# Patient Record
Sex: Female | Born: 1990 | Race: Black or African American | Hispanic: No | Marital: Single | Smoking: Former smoker
Health system: Southern US, Community
[De-identification: ages and names within clinical notes are randomized; demographics above are authoritative.]

## PROBLEM LIST (undated history)

## (undated) DIAGNOSIS — B019 Varicella without complication: Secondary | ICD-10-CM

## (undated) DIAGNOSIS — G43909 Migraine, unspecified, not intractable, without status migrainosus: Secondary | ICD-10-CM

## (undated) DIAGNOSIS — R51 Headache: Secondary | ICD-10-CM

## (undated) HISTORY — DX: Migraine, unspecified, not intractable, without status migrainosus: G43.909

## (undated) HISTORY — DX: Headache: R51

## (undated) HISTORY — DX: Varicella without complication: B01.9

---
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
## 2008-07-10 ENCOUNTER — Ambulatory Visit: Payer: Self-pay | Admitting: Internal Medicine

## 2008-07-10 DIAGNOSIS — R51 Headache: Secondary | ICD-10-CM | POA: Insufficient documentation

## 2008-07-10 DIAGNOSIS — R519 Headache, unspecified: Secondary | ICD-10-CM | POA: Insufficient documentation

## 2008-11-12 ENCOUNTER — Emergency Department (HOSPITAL_COMMUNITY): Admission: EM | Admit: 2008-11-12 | Discharge: 2008-11-12 | Payer: Self-pay | Admitting: Family Medicine

## 2009-01-10 ENCOUNTER — Emergency Department (HOSPITAL_COMMUNITY): Admission: EM | Admit: 2009-01-10 | Discharge: 2009-01-10 | Payer: Self-pay | Admitting: Emergency Medicine

## 2009-06-26 ENCOUNTER — Emergency Department (HOSPITAL_COMMUNITY): Admission: EM | Admit: 2009-06-26 | Discharge: 2009-06-27 | Payer: Self-pay | Admitting: Emergency Medicine

## 2009-06-29 ENCOUNTER — Emergency Department (HOSPITAL_COMMUNITY): Admission: EM | Admit: 2009-06-29 | Discharge: 2009-06-29 | Payer: Self-pay | Admitting: Emergency Medicine

## 2009-10-04 ENCOUNTER — Emergency Department (HOSPITAL_COMMUNITY): Admission: EM | Admit: 2009-10-04 | Discharge: 2009-10-04 | Payer: Self-pay | Admitting: Emergency Medicine

## 2010-02-07 ENCOUNTER — Emergency Department (HOSPITAL_COMMUNITY): Admission: EM | Admit: 2010-02-07 | Discharge: 2010-02-07 | Payer: Self-pay | Admitting: Emergency Medicine

## 2010-04-21 ENCOUNTER — Emergency Department (HOSPITAL_COMMUNITY): Admission: EM | Admit: 2010-04-21 | Discharge: 2010-04-21 | Payer: Self-pay | Admitting: Emergency Medicine

## 2010-08-08 ENCOUNTER — Emergency Department (HOSPITAL_COMMUNITY): Admission: EM | Admit: 2010-08-08 | Discharge: 2010-08-08 | Payer: Self-pay | Admitting: Emergency Medicine

## 2011-02-13 ENCOUNTER — Telehealth: Payer: Self-pay | Admitting: Internal Medicine

## 2011-02-13 ENCOUNTER — Ambulatory Visit (INDEPENDENT_AMBULATORY_CARE_PROVIDER_SITE_OTHER): Payer: BC Managed Care – PPO | Admitting: Internal Medicine

## 2011-02-13 ENCOUNTER — Encounter: Payer: Self-pay | Admitting: Internal Medicine

## 2011-02-13 ENCOUNTER — Other Ambulatory Visit: Payer: BC Managed Care – PPO

## 2011-02-13 ENCOUNTER — Other Ambulatory Visit: Payer: Self-pay | Admitting: Internal Medicine

## 2011-02-13 DIAGNOSIS — IMO0002 Reserved for concepts with insufficient information to code with codable children: Secondary | ICD-10-CM | POA: Insufficient documentation

## 2011-02-13 DIAGNOSIS — R350 Frequency of micturition: Secondary | ICD-10-CM

## 2011-02-13 DIAGNOSIS — N946 Dysmenorrhea, unspecified: Secondary | ICD-10-CM

## 2011-02-13 DIAGNOSIS — D689 Coagulation defect, unspecified: Secondary | ICD-10-CM | POA: Insufficient documentation

## 2011-02-13 DIAGNOSIS — N939 Abnormal uterine and vaginal bleeding, unspecified: Secondary | ICD-10-CM | POA: Insufficient documentation

## 2011-02-13 DIAGNOSIS — F17200 Nicotine dependence, unspecified, uncomplicated: Secondary | ICD-10-CM

## 2011-02-13 LAB — CBC WITH DIFFERENTIAL/PLATELET
Eosinophils Absolute: 0.1 10*3/uL (ref 0.0–0.7)
Hemoglobin: 11.9 g/dL — ABNORMAL LOW (ref 12.0–15.0)
Lymphocytes Relative: 26.8 % (ref 12.0–46.0)
Lymphs Abs: 2 10*3/uL (ref 0.7–4.0)
MCV: 89.8 fl (ref 78.0–100.0)
Monocytes Absolute: 0.5 10*3/uL (ref 0.1–1.0)
Neutrophils Relative %: 65 % (ref 43.0–77.0)
WBC: 7.4 10*3/uL (ref 4.5–10.5)

## 2011-02-13 LAB — URINALYSIS, ROUTINE W REFLEX MICROSCOPIC
Bilirubin Urine: NEGATIVE
Ketones, ur: NEGATIVE
Leukocytes, UA: NEGATIVE
Nitrite: NEGATIVE
Urobilinogen, UA: 1 (ref 0.0–1.0)
pH: 6 (ref 5.0–8.0)

## 2011-02-13 LAB — PROTIME-INR
INR: 1 ratio (ref 0.8–1.0)
Prothrombin Time: 11.3 s (ref 10.2–12.4)

## 2011-02-13 LAB — BASIC METABOLIC PANEL
CO2: 29 mEq/L (ref 19–32)
Creatinine, Ser: 0.8 mg/dL (ref 0.4–1.2)
Glucose, Bld: 80 mg/dL (ref 70–99)

## 2011-02-13 LAB — HEPATIC FUNCTION PANEL
ALT: 11 U/L (ref 0–35)
Alkaline Phosphatase: 51 U/L (ref 39–117)
Bilirubin, Direct: 0 mg/dL (ref 0.0–0.3)
Total Bilirubin: 0.2 mg/dL — ABNORMAL LOW (ref 0.3–1.2)

## 2011-02-13 LAB — APTT: aPTT: 30.6 s — ABNORMAL HIGH (ref 21.7–28.8)

## 2011-02-14 ENCOUNTER — Encounter: Payer: Self-pay | Admitting: Internal Medicine

## 2011-02-18 NOTE — Assessment & Plan Note (Signed)
Summary: new / bcbs / # cd   Vital Signs:  Patient profile:   20 year old female Menstrual status:  regular LMP:     02/10/2011 Height:      64 inches Weight:      166.50 pounds BMI:     28.68 O2 Sat:      98 % on Room air Temp:     98.5 degrees F oral Pulse rate:   86 / minute Pulse rhythm:   regular Resp:     16 per minute BP sitting:   108 / 70  (left arm) Cuff size:   large  Vitals Entered By: Rock Nephew CMA (February 13, 2011 10:37 AM)  Nutrition Counseling: Patient's BMI is greater than 25 and therefore counseled on weight management options.  O2 Flow:  Room air CC: New to establish, Preventive Care Is Patient Diabetic? No Pain Assessment Patient in pain? no       Does patient need assistance? Functional Status Self care Ambulation Normal LMP (date): 02/10/2011     Menstrual Status regular Enter LMP: 02/10/2011   Primary Care Provider:  Etta Grandchild MD  CC:  New to establish and Preventive Care.  History of Present Illness: New to me she was sent here by ? oral surgeon to be evaluated for excessive bleeding. She tells me that one week ago she had a tooth extracted and that she had persistent bleeding for 4 days and that the OS had to cauterize the cavity to stop the bleeding. She reveals to me that she takes advil regularly for menstrual cramps. She has an occasional nosebleed and her periods are heavy but she has no other  bleeding or bruising symptoms. Her father has a history of an occasional nose bleed.  Preventive Screening-Counseling & Management  Alcohol-Tobacco     Alcohol drinks/day: <1     Alcohol type: all     >5/day in last 3 mos: no     Alcohol Counseling: not indicated; use of alcohol is not excessive or problematic     Feels need to cut down: no     Feels annoyed by complaints: no     Feels guilty re: drinking: no     Needs 'eye opener' in am: no     Smoking Status: current     Smoking Cessation Counseling: yes     Smoke Cessation  Stage: contemplative     Packs/Day: 0.5     Year Started: 2011     Pack years: 0.5     Tobacco Counseling: to quit use of tobacco products  Caffeine-Diet-Exercise     Does Patient Exercise: no  Hep-HIV-STD-Contraception     Hepatitis Risk: no risk noted     HIV Risk: no risk noted     STD Risk: no risk noted     Dental Visit-last 6 months yes     Dental Care Counseling: to seek dental care; no dental care within six months  Safety-Violence-Falls     Seat Belt Use: yes     Helmet Use: yes     Firearms in the Home: no firearms in the home     Smoke Detectors: yes     Violence in the Home: no risk noted     Sexual Abuse: no     Fall Risk: no risk noted      Sexual History:  currently monogamous.        Drug Use:  never.  Blood Transfusions:  no.    Current Medications (verified): 1)  Toviaz 4 Mg Xr24h-Tab (Fesoterodine Fumarate) .... One By Mouth Once Daily For Urinary Incontinence  Allergies (verified): No Known Drug Allergies  Past History:  Past Medical History: Last updated: 07/10/2008 Headaches  Family History: Last updated: 02/13/2011 CAD - no Stoke - no DM - M Htn - M Hyperlipidemia - M Breast Ca - no Colon Ca - no Prostate Ca - no Asthma - brother Family History of Thromboembolism clotting disorder Family History of Anemia/FE deficiency  Social History: Last updated: 02/13/2011 Occupation:  Full time at [employer] Single Not sexually active. Drug use-no Current Smoker Alcohol use-yes  Risk Factors: Alcohol Use: <1 (02/13/2011) >5 drinks/d w/in last 3 months: no (02/13/2011) Exercise: no (02/13/2011)  Risk Factors: Smoking Status: current (02/13/2011) Packs/Day: 0.5 (02/13/2011)  Past Surgical History: Denies surgical history  Family History: Reviewed history from 07/10/2008 and no changes required. CAD - no Stoke - no DM - M Htn - M Hyperlipidemia - M Breast Ca - no Colon Ca - no Prostate Ca - no Asthma -  brother Family History of Thromboembolism clotting disorder Family History of Anemia/FE deficiency  Social History: Reviewed history from 07/10/2008 and no changes required. Occupation:  Full time at [employer] Single Not sexually active. Drug use-no Current Smoker Alcohol use-yes Seat Belt Use:  yes Alcohol:  None Smoking Status:  current Does Patient Exercise:  no Fall Risk:  no risk noted Packs/Day:  0.5 Hepatitis Risk:  no risk noted HIV Risk:  no risk noted STD Risk:  no risk noted Dental Care w/in 6 mos.:  yes Sexual History:  currently monogamous Drug Use:  never Blood Transfusions:  no  Review of Systems       The patient complains of weight gain and incontinence.  The patient denies anorexia, fever, weight loss, chest pain, syncope, dyspnea on exertion, peripheral edema, prolonged cough, headaches, hemoptysis, abdominal pain, melena, hematochezia, severe indigestion/heartburn, hematuria, muscle weakness, suspicious skin lesions, transient blindness, difficulty walking, depression, unusual weight change, abnormal bleeding, enlarged lymph nodes, angioedema, and breast masses.   GU:  Complains of abnormal vaginal bleeding, incontinence, and urinary frequency; denies decreased libido, discharge, dysuria, genital sores, hematuria, nocturia, and urinary hesitancy. Heme:  Denies abnormal bruising, bleeding, enlarge lymph nodes, fevers, pallor, and skin discoloration.  Physical Exam  General:  alert, well-developed, well-nourished, well-hydrated, appropriate dress, normal appearance, healthy-appearing, cooperative to examination, good hygiene, and overweight-appearing.   Head:  normocephalic, atraumatic, no abnormalities observed, and no abnormalities palpated.   Eyes:  No corneal or conjunctival inflammation noted. EOMI. Perrla. Funduscopic exam benign, without hemorrhages, exudates or papilledema. Vision grossly normal. Ears:  R ear normal and L ear normal.   Mouth:   good dentition, pharynx pink and moist, no erythema, no exudates, no posterior lymphoid hypertrophy, no postnasal drip, no pharyngeal crowing, no lesions, no aphthous ulcers, no erosions, no tongue abnormalities, no leukoplakia, and no petechiae.   Neck:  supple, full ROM, no masses, no thyromegaly, no thyroid nodules or tenderness, no JVD, normal carotid upstroke, no carotid bruits, no cervical lymphadenopathy, and no neck tenderness.   Lungs:  normal respiratory effort, no intercostal retractions, no accessory muscle use, normal breath sounds, no dullness, no fremitus, no crackles, and no wheezes.   Heart:  normal rate, regular rhythm, no murmur, no gallop, no rub, and no JVD.   Abdomen:  soft, non-tender, normal bowel sounds, no distention, no masses, no guarding, no rigidity, no  rebound tenderness, no abdominal hernia, no inguinal hernia, no hepatomegaly, and no splenomegaly.   Msk:  No deformity or scoliosis noted of thoracic or lumbar spine.   Pulses:  R and L carotid,radial,femoral,dorsalis pedis and posterior tibial pulses are full and equal bilaterally Extremities:  No clubbing, cyanosis, edema, or deformity noted with normal full range of motion of all joints.   Neurologic:  No cranial nerve deficits noted. Station and gait are normal. Plantar reflexes are down-going bilaterally. DTRs are symmetrical throughout. Sensory, motor and coordinative functions appear intact. Skin:  turgor normal, color normal, no rashes, no suspicious lesions, no ecchymoses, no petechiae, no purpura, no ulcerations, no edema, and tattoo(s).   Cervical Nodes:  no anterior cervical adenopathy and no posterior cervical adenopathy.   Axillary Nodes:  no R axillary adenopathy and no L axillary adenopathy.   Inguinal Nodes:  no R inguinal adenopathy and no L inguinal adenopathy.   Psych:  Cognition and judgment appear intact. Alert and cooperative with normal attention span and concentration. No apparent delusions,  illusions, hallucinations   Impression & Recommendations:  Problem # 1:  HEMORRHAGE COMPLICATING A PROCEDURE NEC (ICD-998.11) Assessment New I think this was caused by her use of advil and its effects on the platelets, will check labs today to look for a coagulopathy as well Orders: TLB-BMP (Basic Metabolic Panel-BMET) (80048-METABOL) TLB-CBC Platelet - w/Differential (85025-CBCD) TLB-Hepatic/Liver Function Pnl (80076-HEPATIC) TLB-TSH (Thyroid Stimulating Hormone) (84443-TSH) TLB-PTT (85730-PTTL) TLB-PT (Protime) (85610-PTP)  Problem # 2:  FREQUENCY, URINARY (ICD-788.41) Assessment: New this sounds like OAB with urge incontinence, will start Toviaz Her updated medication list for this problem includes:    Toviaz 4 Mg Xr24h-tab (Fesoterodine fumarate) ..... One by mouth once daily for urinary incontinence  Orders: TLB-Udip w/ Micro (81001-URINE)  Problem # 3:  TOBACCO USE (ICD-305.1) Assessment: New  Encouraged smoking cessation and discussed different methods for smoking cessation.   Complete Medication List: 1)  Toviaz 4 Mg Xr24h-tab (Fesoterodine fumarate) .... One by mouth once daily for urinary incontinence  Other Orders: Gynecologic Referral (Gyn)  PAP Screening:    Reviewed PAP smear recommendations:  patient defers to GYN provider  Osteoporosis Risk Assessment:  Risk Factors for Fracture or Low Bone Density:   Smoking status:       current  Immunization & Chemoprophylaxis:    Tetanus vaccine: given  (06/12/2008)  Patient Instructions: 1)  Please schedule a follow-up appointment in 1 month. 2)  Tobacco is very bad for your health and your loved ones! You Should stop smoking!. 3)  Stop Smoking Tips: Choose a Quit date. Cut down before the Quit date. decide what you will do as a substitute when you feel the urge to smoke(gum,toothpick,exercise). 4)  It is important that you exercise regularly at least 20 minutes 5 times a week. If you develop chest pain, have  severe difficulty breathing, or feel very tired , stop exercising immediately and seek medical attention. 5)  You need to lose weight. Consider a lower calorie diet and regular exercise.  6)  You need to have a Pap Smear to prevent cervical cancer. 7)  If you could be exposed to sexually transmitted diseases, you should use a condom. 8)  If you are having sex and you or your partner don't want a child, use contraception. 9)  Take 650-1000mg  of Tylenol every 4-6 hours as needed for relief of pain or comfort of fever AVOID taking more than 4000mg   in a 24 hour period (can cause liver  damage in higher doses). Prescriptions: TOVIAZ 4 MG XR24H-TAB (FESOTERODINE FUMARATE) One by mouth once daily for urinary incontinence  #68 x 0   Entered and Authorized by:   Etta Grandchild MD   Signed by:   Etta Grandchild MD on 02/13/2011   Method used:   Samples Given   RxID:   [id number]    Orders Added: 1)  Gynecologic Referral [Gyn] 2)  TLB-BMP (Basic Metabolic Panel-BMET) [80048-METABOL] 3)  TLB-CBC Platelet - w/Differential [85025-CBCD] 4)  TLB-Hepatic/Liver Function Pnl [80076-HEPATIC] 5)  TLB-TSH (Thyroid Stimulating Hormone) [84443-TSH] 6)  TLB-PTT [85730-PTTL] 7)  TLB-PT (Protime) [85610-PTP] 8)  TLB-Udip w/ Micro [81001-URINE] 9)  New Patient Level IV [14782]

## 2011-02-18 NOTE — Letter (Signed)
Summary: Generic Letter  [facility]  [address] [place], [place] [address]   Phone: [phone]  Fax: [phone]    02/14/2011  MARCELE KOSTA [address] [facility] [place], [place]  [address]  [place]  Dear Ms. Buer,   This letter is regarding some recent lab results the we have on file for   you. Please return call to our office to discuss results and referral   appointment.      Sincerely,   Alvy Beal A CMA for Dr. Sanda Linger

## 2011-02-18 NOTE — Letter (Signed)
Summary: Results Follow-up Letter  [facility]  [address] [place], [place] [address]   Phone: [phone]  Fax: [phone]    02/13/2011  [address] [facility] [place], [place]  [address]  [place]  Dear Ms. Weldon,   The following are the results of your recent test(s):  Test     Result     CBC       mild anemia Bleeding tests   one was abnormal Liver/kidney   normal   _________________________________________________________  Please call for an appointment soon _________________________________________________________ _________________________________________________________ _________________________________________________________  Sincerely,  Sanda Linger MD [facility]

## 2011-02-18 NOTE — Progress Notes (Signed)
  Phone Note Outgoing Call   Summary of Call: LA - please tell her that one of her bleeding tests was abnormal so I have referred her to a blood specialist Initial call taken by: Etta Grandchild MD,  February 13, 2011 2:39 PM  Follow-up for Phone Call        Called pt, no personal VM and alternative # busy x 3. Will try again later.Marland KitchenMarland KitchenAlvy Beal Archie CMA  February 13, 2011 2:51 PM  Follow-up by: Etta Grandchild MD,  February 13, 2011 2:52 PM  Additional Follow-up for Phone Call Additional follow up Details #1::        Called home number on file and told this is the wrong number. Alternative number busy. will mail out letter.Marland KitchenMarland KitchenAlvy Beal Archie CMA  February 14, 2011 9:17 AM      Appended Document:  Pt informed of results and referral sent. Have correct phone#. Will update in chart.

## 2011-02-18 NOTE — Progress Notes (Signed)
     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

## 2011-02-20 ENCOUNTER — Encounter (HOSPITAL_BASED_OUTPATIENT_CLINIC_OR_DEPARTMENT_OTHER): Payer: BC Managed Care – PPO | Admitting: Hematology and Oncology

## 2011-02-20 DIAGNOSIS — D689 Coagulation defect, unspecified: Secondary | ICD-10-CM

## 2011-02-27 ENCOUNTER — Other Ambulatory Visit: Payer: Self-pay | Admitting: Hematology and Oncology

## 2011-02-27 ENCOUNTER — Encounter (HOSPITAL_BASED_OUTPATIENT_CLINIC_OR_DEPARTMENT_OTHER): Payer: BC Managed Care – PPO | Admitting: Hematology and Oncology

## 2011-02-27 DIAGNOSIS — D689 Coagulation defect, unspecified: Secondary | ICD-10-CM

## 2011-02-27 LAB — CBC WITH DIFFERENTIAL/PLATELET
BASO%: 0.9 % (ref 0.0–2.0)
Eosinophils Absolute: 0.1 10*3/uL (ref 0.0–0.5)
HCT: 37 % (ref 34.8–46.6)
MONO#: 0.3 10*3/uL (ref 0.1–0.9)
MONO%: 4.4 % (ref 0.0–14.0)
NEUT%: 65.9 % (ref 38.4–76.8)
RBC: 4.15 10*6/uL (ref 3.70–5.45)
RDW: 14 % (ref 11.2–14.5)
lymph#: 2.2 10*3/uL (ref 0.9–3.3)

## 2011-02-27 LAB — PROTIME-INR: INR: 0.9 — ABNORMAL LOW (ref 2.00–3.50)

## 2011-02-27 LAB — IVY BLEEDING TIME: Bleeding Time: 6 Minutes (ref 2.0–8.0)

## 2011-03-05 LAB — COMPREHENSIVE METABOLIC PANEL
ALT: <8 U/L (ref 0–35)
AST: 11 U/L (ref 0–37)
Albumin: 4.7 g/dL (ref 3.5–5.2)
Alkaline Phosphatase: 56 U/L (ref 39–117)
CO2: 26 mEq/L (ref 19–32)
Calcium: 9.7 mg/dL (ref 8.4–10.5)
Creatinine, Ser: 0.75 mg/dL (ref 0.40–1.20)
Potassium: 4.1 mEq/L (ref 3.5–5.3)
Total Bilirubin: 0.3 mg/dL (ref 0.3–1.2)

## 2011-03-05 LAB — POCT PREGNANCY, URINE: Preg Test, Ur: NEGATIVE

## 2011-03-05 LAB — VON WILLEBRAND FACTOR MULTIMER: Factor-VIII Activity: 58 % (ref 50–180)

## 2011-03-07 ENCOUNTER — Encounter (HOSPITAL_BASED_OUTPATIENT_CLINIC_OR_DEPARTMENT_OTHER): Payer: BC Managed Care – PPO | Admitting: Hematology and Oncology

## 2011-03-07 DIAGNOSIS — D689 Coagulation defect, unspecified: Secondary | ICD-10-CM

## 2011-03-12 ENCOUNTER — Other Ambulatory Visit: Payer: Self-pay | Admitting: Hematology and Oncology

## 2011-03-12 ENCOUNTER — Encounter (HOSPITAL_BASED_OUTPATIENT_CLINIC_OR_DEPARTMENT_OTHER): Payer: BC Managed Care – PPO | Admitting: Hematology and Oncology

## 2011-03-12 DIAGNOSIS — D689 Coagulation defect, unspecified: Secondary | ICD-10-CM

## 2011-03-14 LAB — PLATELET AGGREGATION STUDY, BLOOD
ADP5: NORMAL
Thrombin Receptor: NORMAL

## 2011-03-18 ENCOUNTER — Encounter: Payer: BC Managed Care – PPO | Admitting: Hematology and Oncology

## 2011-03-19 ENCOUNTER — Ambulatory Visit: Payer: BC Managed Care – PPO | Admitting: Internal Medicine

## 2011-03-19 DIAGNOSIS — Z0289 Encounter for other administrative examinations: Secondary | ICD-10-CM

## 2011-03-21 LAB — FACTOR 11 ASSAY: Factor XI Activity: 123 % (ref 65–150)

## 2011-03-21 LAB — ANA: Anti Nuclear Antibody(ANA): NEGATIVE

## 2011-03-21 LAB — FACTOR 8 ASSAY: Coagulation Factor VIII: 55 % — ABNORMAL LOW (ref 73–140)

## 2011-03-21 LAB — FACTOR 13 ACTIVITY: Factor XIII, Qualitative: NEGATIVE

## 2011-03-26 ENCOUNTER — Other Ambulatory Visit: Payer: Self-pay | Admitting: Hematology and Oncology

## 2011-03-26 ENCOUNTER — Encounter (HOSPITAL_BASED_OUTPATIENT_CLINIC_OR_DEPARTMENT_OTHER): Payer: BC Managed Care – PPO | Admitting: Hematology and Oncology

## 2011-03-26 DIAGNOSIS — D689 Coagulation defect, unspecified: Secondary | ICD-10-CM

## 2011-03-26 DIAGNOSIS — D699 Hemorrhagic condition, unspecified: Secondary | ICD-10-CM

## 2011-03-27 LAB — FACTOR 8 ASSAY: Coagulation Factor VIII: 49 % — ABNORMAL LOW (ref 73–140)

## 2011-04-18 NOTE — H&P (Signed)
NAMEJENDAYI, Danielle Gross               ACCOUNT NO.:  [id number]   MEDICAL RECORD NO.:  [id number]          PATIENT TYPE:  AMB   LOCATION:  SDC                           [facility]ACILITY:  [facility]   PHYSICIAN:  Charles A. Delcambre, MDDATE OF BIRTH:  1991/04/02   DATE OF ADMISSION:  06/16/2005  DATE OF DISCHARGE:                                HISTORY & PHYSICAL   This patient will be admitted through same-day surgery at [facility] on June 16, 2005, for marsupialization of Bartholin's cyst.  A 20 year old para 0-0-  0-0, from Dr. Cindi Carbon at [facility], with recurrent Bartholin abscesses  and cysts, symptomatic, now with no symptoms but a tense Bartholin cyst on  the right.  She is to be admitted for a marsupialization at this time having  completed a course of Keflex recently.   PAST MEDICAL HISTORY:  None.   PAST SURGICAL HISTORY:  None.   MEDICATIONS:  None.   ALLERGIES:  No known drug allergies.   SOCIAL HISTORY:  Negative tobacco, ethanol or drug use.  Not sexually  active.  No STDs in the past.   FAMILY HISTORY:  No major illnesses other than diabetes in her father,  hypertension in her mother.   REVIEW OF SYSTEMS:  No chest pain, shortness of breath, wheezing, diarrhea  or constipation.  Otherwise as noted above.   PHYSICAL EXAMINATION:  GENERAL:  Well-developed, well-nourished, in no  distress.  VITAL SIGNS:  Blood pressure 100/78, respirations 16, heart rate 80, weight  148 pounds.  HEENT:  Grossly within normal limits.  NECK:  Supple without thyromegaly or adenopathy.  CHEST:  Lungs clear bilaterally.  BACK:  No CVAT.  CARDIAC:  Regular rate and rhythm without murmur, rub, or gallop.  BREASTS:  Symmetrical, deferred otherwise.  ABDOMEN:  Soft, flat and nontender.  No hepatosplenomegaly or other masses  noted.  PELVIC:  Normal external female genitalia.  Bartholin's, urethra and Skene's  glands within normal limits.  Vault without discharge or lesions.  Right  Bartholin's  gland, a 3 cm tense cyst is noted, nontender.  Anus and perineal  body appear normal.  Otherwise, rectal exam deferred.   ASSESSMENT:  Right Bartholin cyst, recurrent, with recurrent abscesses, now  for marsupialization.   PLAN:  Admit for same-day surgery to undergo marsupialization.  Plan 1 g  Cefoxitin preop, preop CBC and serum pregnancy test.  All questions were  answered.  She accepts the risks of infection, bleeding, surrounding tissue  damage, blood product risk and hepatitis/HIV exposure.  All questions were  answered.  Recurrent cyst or other abscess risk is noted as well.  Complete  resolution or guarantee of resolution cannot be guaranteed.  We will proceed  as outlined.  She will remain N.P.O. past midnight.       CAD/MEDQ  D:  06/10/2005  T:  06/10/2005  Job:  [id number]

## 2011-04-18 NOTE — Op Note (Signed)
NAMETEREASA, Danielle Gross               ACCOUNT NO.:  [id number]   MEDICAL RECORD NO.:  [id number]          PATIENT TYPE:  AMB   LOCATION:  SDC                           [facility]ILITY:  [facility]   PHYSICIAN:  Charles A. Delcambre, MDDATE OF BIRTH:  [birth year]   DATE OF PROCEDURE:  06/16/2005  DATE OF DISCHARGE:                                 OPERATIVE REPORT   PREOPERATIVE DIAGNOSIS:  Bartholin cyst.   POSTOPERATIVE DIAGNOSIS:  Bartholin cyst.   OPERATION/PROCEDURE:  Marsupialization of same Bartholin cyst.   SURGEON:  Charles A. Sydnee Cabal, M.D.   ASSISTANT:  None.   COMPLICATIONS:  None.   ESTIMATED BLOOD LOSS:  Less than 10 mL.   ANESTHESIA:  General via endotracheal route.   SPECIMENS:  None except cultures, aerobic and anaerobic, of cyst to the lab.   COUNTS:  Sponge and needle counts correct x2.   DESCRIPTION OF PROCEDURE:  The patient was taken to the operating room and  placed in the supine position.  Anesthesia was induced without difficulty.  She was then placed in the dorsal lithotomy position and sterile prep and  drape was undertaken.  The Bartholin cyst was isolated, approximately 3 cm,  and 1 cm area of vaginal mucosa on the lateral sidewall was excised just  inside the introitus, but external to the hymnal ring and area of the cyst  and was entered.  Cultures were taken.  The cyst was opened and irrigated  copiously with normal saline and cyst wall was marsupialized to the vaginal  mucosa circumferentially with 2-0 Vicryl.  Hemostasis was excellent.  Word  catheter was placed to further maintain open drainage. This was tucked into  the vagina.  The procedure was then terminated and the patient was taken to  the recovery with physician in attendance.       CAD/MEDQ  D:  06/16/2005  T:  06/17/2005  Job:  [id number]

## 2011-06-10 ENCOUNTER — Ambulatory Visit (HOSPITAL_BASED_OUTPATIENT_CLINIC_OR_DEPARTMENT_OTHER): Payer: BC Managed Care – PPO | Admitting: Psychology

## 2011-06-10 DIAGNOSIS — F4323 Adjustment disorder with mixed anxiety and depressed mood: Secondary | ICD-10-CM

## 2011-06-17 ENCOUNTER — Encounter (HOSPITAL_COMMUNITY): Payer: BC Managed Care – PPO | Admitting: Psychology

## 2011-10-17 ENCOUNTER — Emergency Department (INDEPENDENT_AMBULATORY_CARE_PROVIDER_SITE_OTHER)
Admission: EM | Admit: 2011-10-17 | Discharge: 2011-10-17 | Disposition: A | Discharge status: Home / Self Care | Payer: BC Managed Care – PPO | Source: Home / Self Care | Attending: Emergency Medicine | Admitting: Emergency Medicine

## 2011-10-17 ENCOUNTER — Encounter (HOSPITAL_COMMUNITY): Payer: Self-pay | Admitting: *Deleted

## 2011-10-17 DIAGNOSIS — J40 Bronchitis, not specified as acute or chronic: Secondary | ICD-10-CM

## 2011-10-17 MED ORDER — HYDROCODONE-ACETAMINOPHEN 7.5-500 MG/15ML PO SOLN: 5.0000 mL | Freq: Four times a day (QID) | ORAL | Status: AC | PRN

## 2011-10-17 MED ORDER — IBUPROFEN 600 MG PO TABS: 600.0000 mg | ORAL_TABLET | Freq: Four times a day (QID) | ORAL | Status: AC | PRN

## 2011-10-17 MED ORDER — ALBUTEROL SULFATE HFA 108 (90 BASE) MCG/ACT IN AERS: 1.0000 | INHALATION_SPRAY | Freq: Four times a day (QID) | RESPIRATORY_TRACT | Status: DC | PRN

## 2011-10-17 NOTE — ED Notes (Signed)
C/O productive cough x 1 wk w/ greenish yellow sputum; c/o pain in right low back and hip when she coughs over past 3 days.  Also c/o "cyst" to lower abd x 1 month that is getting larger and more painful.  Has been taking Tyl Cough & Cold and IBU.  Denies fevers.

## 2011-10-17 NOTE — ED Provider Notes (Signed)
History     CSN: [id number] Arrival date & time: 10/17/2011  6:06 PM   First MD Initiated Contact with Patient 10/17/11 1743      Chief Complaint  Patient presents with  . Cough  . Cyst    (Consider location/radiation/quality/duration/timing/severity/associated sxs/prior treatment) HPI Comments: Initially with bodyaches, low grade temp. Rhinorrhea, postnasal drip, irritated throat.   Patient is a 20 y.o. female presenting with cough. The history is provided by the patient.  Cough The current episode started more than 2 days ago. The problem has not changed since onset.The cough is non-productive. The maximum temperature recorded prior to her arrival was 100 to 100.9 F. The fever has been present for 1 to 2 days (at beginning of illness. now resolved). Associated symptoms include rhinorrhea, sore throat, myalgias and wheezing. Pertinent negatives include no chest pain, no chills, no sweats, no ear congestion, no ear pain, no headaches, no shortness of breath and no eye redness. Associated symptoms comments: No posttussive emesis. The treatment provided no relief. She is a smoker. Her past medical history does not include bronchitis, pneumonia, emphysema or asthma.    Past Medical History  Diagnosis Date  . Headache     History reviewed. No pertinent past surgical history.  Family History  Problem Relation Age of Onset  . Diabetes Mother   . Hypertension Mother   . Hyperlipidemia Mother   . Asthma Brother   . Stroke Neg Hx   . Cancer Neg Hx   . Anemia Other   . Clotting disorder Other     History  Substance Use Topics  . Smoking status: Current Some Day Smoker  . Smokeless tobacco: Not on file  . Alcohol Use: Yes     occasional use    OB History    Grav Para Term Preterm Abortions TAB SAB Ect Mult Living                  Review of Systems  Constitutional: Positive for fever. Negative for chills.  HENT: Positive for congestion, sore throat, rhinorrhea and  postnasal drip. Negative for ear pain, trouble swallowing, neck pain and sinus pressure.   Eyes: Negative for redness.  Respiratory: Positive for cough and wheezing. Negative for shortness of breath.   Cardiovascular: Negative for chest pain.  Gastrointestinal: Negative for vomiting and abdominal pain.  Musculoskeletal: Positive for myalgias.  Skin: Negative for rash.  Neurological: Negative for weakness and headaches.    Allergies  Review of patient's allergies indicates no known allergies.  Home Medications   Current Outpatient Rx  Name Route Sig Dispense Refill  . ALBUTEROL SULFATE HFA 108 (90 BASE) MCG/ACT IN AERS Inhalation Inhale 1-2 puffs into the lungs every 6 (six) hours as needed for wheezing. 1 Inhaler 0  . HYDROCODONE-ACETAMINOPHEN 7.5-500 MG/15ML PO SOLN Oral Take 5 mLs by mouth every 6 (six) hours as needed for pain. 120 mL 0  . IBUPROFEN 600 MG PO TABS Oral Take 1 tablet (600 mg total) by mouth every 6 (six) hours as needed for pain. 30 tablet 0    BP 120/89  Pulse 66  Temp(Src) 98.6 F (37 C) (Oral)  Resp 14  SpO2 100%  LMP 10/09/2011  Physical Exam  Nursing note and vitals reviewed. Constitutional: She is oriented to person, place, and time. She appears well-developed and well-nourished. No distress.  HENT:  Head: Normocephalic and atraumatic.  Right Ear: Tympanic membrane normal.  Left Ear: Tympanic membrane normal.  Nose: Mucosal edema and  rhinorrhea present.  Mouth/Throat: Uvula is midline. Posterior oropharyngeal erythema present. No oropharyngeal exudate.  Eyes: EOM are normal. Pupils are equal, round, and reactive to light.  Neck: Normal range of motion.  Cardiovascular: Normal rate, regular rhythm and normal heart sounds.   Pulmonary/Chest: Effort normal and breath sounds normal. No respiratory distress. She has no wheezes. She has no rales. She exhibits tenderness.  Abdominal: Soft. Bowel sounds are normal. She exhibits no distension. There is no  tenderness.  Musculoskeletal: Normal range of motion.  Lymphadenopathy:    She has no cervical adenopathy.  Neurological: She is alert and oriented to person, place, and time.  Skin: Skin is warm and dry.  Psychiatric: She has a normal mood and affect. Her behavior is normal. Judgment and thought content normal.    ED Course  Procedures (including critical care time)  Labs Reviewed - No data to display No results found.   1. Bronchitis       MDM      Luiz Blare, MD 10/17/11 2021

## 2013-02-23 ENCOUNTER — Encounter (HOSPITAL_COMMUNITY): Payer: Self-pay | Admitting: Emergency Medicine

## 2013-02-23 ENCOUNTER — Telehealth: Payer: Self-pay | Admitting: Internal Medicine

## 2013-02-23 ENCOUNTER — Emergency Department (INDEPENDENT_AMBULATORY_CARE_PROVIDER_SITE_OTHER)
Admission: EM | Admit: 2013-02-23 | Discharge: 2013-02-23 | Disposition: A | Discharge status: Home / Self Care | Payer: BC Managed Care – PPO | Source: Home / Self Care

## 2013-02-23 DIAGNOSIS — J029 Acute pharyngitis, unspecified: Secondary | ICD-10-CM

## 2013-02-23 DIAGNOSIS — I889 Nonspecific lymphadenitis, unspecified: Secondary | ICD-10-CM

## 2013-02-23 NOTE — Telephone Encounter (Signed)
yes

## 2013-02-23 NOTE — ED Provider Notes (Signed)
History     CSN: [id number]  Arrival date & time 02/23/13  1121   First MD Initiated Contact with Patient 02/23/13 1252      Chief Complaint  Patient presents with  . Sore Throat    (Consider location/radiation/quality/duration/timing/severity/associated sxs/prior treatment) HPI Comments: 22 year old female presents with sore throat for one week. She states in the past couple days has been getting worse with  swelling in the throat, throat pain and wuite patches on her tonsils. She states that about 3 days ago she had a temperature of 99.1. Is also complaining of discomfort in her ears. Denies shortness of breath, chest pain, GI or GU symptoms.   Past Medical History  Diagnosis Date  . Headache     History reviewed. No pertinent past surgical history.  Family History  Problem Relation Age of Onset  . Diabetes Mother   . Hypertension Mother   . Hyperlipidemia Mother   . Asthma Brother   . Stroke Neg Hx   . Cancer Neg Hx   . Anemia Other   . Clotting disorder Other     History  Substance Use Topics  . Smoking status: Current Some Day Smoker  . Smokeless tobacco: Not on file  . Alcohol Use: Yes     Comment: occasional use    OB History   Grav Para Term Preterm Abortions TAB SAB Ect Mult Living                  Review of Systems  Constitutional: Positive for fever, activity change and fatigue. Negative for chills.  HENT: Positive for ear pain and sore throat. Negative for rhinorrhea, neck pain, neck stiffness, dental problem, postnasal drip and ear discharge.   Respiratory: Negative.   Cardiovascular: Negative.   Gastrointestinal: Negative.   Genitourinary: Negative.   Musculoskeletal: Negative.   Hematological: Positive for adenopathy.    Allergies  Review of patient's allergies indicates no known allergies.  Home Medications   Current Outpatient Rx  Name  Route  Sig  Dispense  Refill  . EXPIRED: albuterol (PROVENTIL HFA;VENTOLIN HFA) 108 (90 BASE)  MCG/ACT inhaler   Inhalation   Inhale 1-2 puffs into the lungs every 6 (six) hours as needed for wheezing.   1 Inhaler   0     BP 104/56  Pulse 81  Temp(Src) 98.4 F (36.9 C) (Oral)  SpO2 100%  LMP 02/23/2013  Physical Exam  Nursing note and vitals reviewed. Constitutional: She is oriented to person, place, and time. She appears well-developed and well-nourished. No distress.  HENT:  Bilateral TMs are normal Oropharynx with erythema, small bilateral palatine tonsils with erythema and gray exudates. Small mobile and mildly tender posterior and anterior cervical nodes.  Eyes: Conjunctivae and EOM are normal.  Neck: Normal range of motion. Neck supple.  Cardiovascular: Normal rate, regular rhythm and normal heart sounds.   Pulmonary/Chest: Effort normal and breath sounds normal. No respiratory distress. She has no wheezes.  Musculoskeletal: Normal range of motion. She exhibits no edema.  Lymphadenopathy:    She has cervical adenopathy.  Neurological: She is alert and oriented to person, place, and time.  Skin: Skin is warm and dry. No rash noted.  Psychiatric: She has a normal mood and affect.    ED Course  Procedures (including critical care time)  Labs Reviewed  POCT RAPID STREP A ([facility] ONLY)   No results found.  Results for orders placed during the hospital encounter of 02/23/13  POCT RAPID  STREP A ([facility] ONLY)      Result Value Range   Streptococcus, Group A Screen (Direct) NEGATIVE  NEGATIVE      1. Exudative pharyngitis   2. Cervical lymphadenitis       MDM  Rapid strep and monotest are negative. Instructions for viral and bacterial pharyngitis given. Warm salt water gargles when necessary Cool liquids and sitting liquids. Drink plenty of fluids and stay well-hydrated Cepacol lozenges as needed for sore throat pain Ibuprofen 600 mg every 6-8 hours when necessary pain Tylenol every 4-6 hours when necessary pain. Rest of the next couple  days no strenuous activity.  Hayden Rasmussen, NP 02/23/13 1356

## 2013-02-23 NOTE — Telephone Encounter (Signed)
appt set/kh 

## 2013-02-23 NOTE — ED Notes (Signed)
Pt c/o sore throat x1 week. Noticed white spots on throat last night. No fever, congestion, runny nose. Hurts to swallow. No SOB. Has tried throat lozenges, hot tea, and advil with no relief. Patient is alert and oriented with no signs of distress.

## 2013-02-23 NOTE — Telephone Encounter (Signed)
Pt would like to switch to Dr Selena Batten. Is that OK with you? Pt's mother is a patient here. Pt would like to switch to you from Dr Yetta Barre. Is that ok with you?

## 2013-02-23 NOTE — Telephone Encounter (Signed)
Yes. Needs new pt visit.

## 2013-02-24 NOTE — ED Provider Notes (Signed)
Medical screening examination/treatment/procedure(s) were performed by non-physician practitioner and as supervising physician I was immediately available for consultation/collaboration.   MORENO-COLL,Georgie Eduardo; MD  Marabelle Cushman Moreno-Coll, MD 02/24/13 1020 

## 2013-02-28 ENCOUNTER — Encounter: Payer: Self-pay | Admitting: Family Medicine

## 2013-02-28 ENCOUNTER — Ambulatory Visit (INDEPENDENT_AMBULATORY_CARE_PROVIDER_SITE_OTHER): Payer: BC Managed Care – PPO | Admitting: Family Medicine

## 2013-02-28 VITALS — BP 100/70 | HR 90 | Temp 98.6°F | Ht 62.0 in | Wt 149.0 lb

## 2013-02-28 DIAGNOSIS — IMO0002 Reserved for concepts with insufficient information to code with codable children: Secondary | ICD-10-CM

## 2013-02-28 DIAGNOSIS — R6889 Other general symptoms and signs: Secondary | ICD-10-CM

## 2013-02-28 DIAGNOSIS — R87619 Unspecified abnormal cytological findings in specimens from cervix uteri: Secondary | ICD-10-CM | POA: Insufficient documentation

## 2013-02-28 DIAGNOSIS — J069 Acute upper respiratory infection, unspecified: Secondary | ICD-10-CM

## 2013-02-28 NOTE — Patient Instructions (Addendum)
-  We have ordered labs or studies at this visit. It can take up to 1-2 weeks for results and processing. We will contact you with instructions IF your results are abnormal. Normal results will be released to your MYCHART. If you have not heard from us or can not find your results in MYCHART in 2 weeks please contact our office.  -PLEASE SIGN UP FOR MYCHART TODAY   We recommend the following healthy lifestyle measures: - eat a healthy diet consisting of lots of vegetables, fruits, beans, nuts, seeds, healthy meats such as white chicken and fish and whole grains.  - avoid fried foods, fast food, processed foods, sodas, red meet and other fattening foods.  - get a least 150 minutes of aerobic exercise per week.   Follow up in: 1 year or as needed  

## 2013-02-28 NOTE — Progress Notes (Signed)
Chief Complaint  Patient presents with  . Establish Care  . Follow-up    urgent care; sore throat x 2 weeks at night cold sweats, cough     HPI:  Danielle Gross is here to establish care.   Has the following chronic problems and concerns today:  Sore throat/cough/ congestion: -started 1-2 weeks ago, seen in urgent care -getting better - but still has symptoms at night -denies SOB, fevers, NVD  Patient Active Problem List  Diagnosis  . HEADACHE  . COAGULOPATHY workup in 2013, per pt done after abnormal bleeding with procedure for abnormal pap and all normal  . HEMORRHAGE COMPLICATING A PROCEDURE NEC  . Abnormal pap - followed by Dr. Hollice Espy at [facility]    Health Maintenance: -sees Dr. Hollice Espy for paps and female exams - next appt 03/01/13 -FDLMP: 1 week ago, normal, monthly  ROS: See pertinent positives and negatives per HPI.  Past Medical History  Diagnosis Date  . Headache   . Chicken pox   . Migraines     Family History  Problem Relation Age of Onset  . Diabetes Mother   . Hypertension Mother   . Hyperlipidemia Mother   . Asthma Brother   . Stroke Neg Hx   . Cancer Neg Hx   . Anemia Other   . Clotting disorder Other     History   Social History  . Marital Status: Single    Spouse Name: N/A    Number of Children: N/A  . Years of Education: N/A   Social History Main Topics  . Smoking status: Former Games developer  . Smokeless tobacco: None  . Alcohol Use: Yes     Comment: occasional use  . Drug Use: No  . Sexually Active: No   Other Topics Concern  . None   Social History Narrative   Work or School: currently no working or going to school      Home Situation: lives with parents      Spiritual Beliefs: Christian      Lifestyle: walks dog daily, diet is ok             No current outpatient prescriptions on file.  EXAM:  Filed Vitals:   02/28/13 0829  BP: 100/70  Pulse: 90  Temp: 98.6 F (37 C)    Body mass index is 27.25  kg/(m^2).  GENERAL: vitals reviewed and listed above, alert, oriented, appears well hydrated and in no acute distress  HEENT: atraumatic, conjunttiva clear, no obvious abnormalities on inspection of external nose and ears, normal appearance of ear canals and TMs, clear nasal congestion, mild post oropharyngeal erythema with PND, no tonsillar edema or exudate, no sinus TTP  NECK: no obvious masses on inspection  LUNGS: clear to auscultation bilaterally, no wheezes, rales or rhonchi, good air movement  CV: HRRR, no peripheral edema  MS: moves all extremities without noticeable abnormality  PSYCH: pleasant and cooperative, no obvious depression or anxiety  ASSESSMENT AND PLAN:  Discussed the following assessment and plan:  Abnormal pap - followed by Dr. Hollice Espy at [facility]  Upper respiratory infection -We reviewed the PMH, PSH, FH, SH, Meds and Allergies. -VURI: improving, advised nasal saline, plenty of fluids, salt water gargles - from exam likely common cold, advised other infections can cause acute sore throat and offered STI testing - pt will discuss with gyn tomorrow -sees gyn tomorrow for yearly female exam and pap  -follow up prn  -Patient advised to return  or notify a doctor immediately if symptoms worsen or persist or new concerns arise.  Patient Instructions  -We have ordered labs or studies at this visit. It can take up to 1-2 weeks for results and processing. We will contact you with instructions IF your results are abnormal. Normal results will be released to your [facility]. If you have not heard from [place] or can not find your results in [facility] in 2 weeks please contact our office.  -PLEASE SIGN UP FOR MYCHART TODAY   We recommend the following healthy lifestyle measures: - eat a healthy diet consisting of lots of vegetables, fruits, beans, nuts, seeds, healthy meats such as white chicken and fish and whole grains.  - avoid fried foods, fast food, processed  foods, sodas, red meet and other fattening foods.  - get a least 150 minutes of aerobic exercise per week.   Follow up in:      Kriste Basque R.

## 2013-03-02 ENCOUNTER — Telehealth: Payer: Self-pay | Admitting: Family Medicine

## 2013-03-02 NOTE — Telephone Encounter (Signed)
Returned called to both the patient and her mother who are concerned with patient's severe sore throat.  Patient states she saw Dr. Selena Batten on Monday(3/31) after a urgent care visit over the weekend.  Patient states Dr. Selena Batten informed her she had a viral "cold".  Patient was instructed to gargle with warm salt water.  The patient states she is not feeling any better even with doing the salt water gargles.  Patient's mother is a patient of Dr. Artist Pais and wants the patient to see Dr. Artist Pais as well.  Patient states she originally requested to see Dr. Artist Pais but he did not have a new patient appointment available soon enough therefore patient agreed to see Dr. Selena Batten.  Pt requesting:  1. Alternative treatment advise for sore throat and cough.  2.  Switch PCP from Dr. Selena Batten to Dr. Artist Pais

## 2013-03-02 NOTE — Telephone Encounter (Signed)
I am ok with switch if Dr. Artist Pais is ok. Symptoms and exam at her appt c/w most likely a viral upper resp infection. Symptoms can last several weeks - on average 18 days. Can use tylenol or ibuprofen. If persistent or worsening should be seen to re-evaluate.

## 2013-03-02 NOTE — Telephone Encounter (Signed)
Ok with me 

## 2013-03-03 ENCOUNTER — Ambulatory Visit: Payer: BC Managed Care – PPO | Admitting: Internal Medicine

## 2013-03-03 DIAGNOSIS — Z0289 Encounter for other administrative examinations: Secondary | ICD-10-CM

## 2013-03-04 ENCOUNTER — Ambulatory Visit: Payer: BC Managed Care – PPO | Admitting: Internal Medicine

## 2013-06-10 ENCOUNTER — Emergency Department (HOSPITAL_COMMUNITY)
Admission: EM | Admit: 2013-06-10 | Discharge: 2013-06-10 | Disposition: A | Discharge status: Home / Self Care | Payer: BC Managed Care – PPO | Attending: Emergency Medicine | Admitting: Emergency Medicine

## 2013-06-10 ENCOUNTER — Emergency Department (HOSPITAL_COMMUNITY): Payer: BC Managed Care – PPO

## 2013-06-10 ENCOUNTER — Encounter (HOSPITAL_COMMUNITY): Payer: Self-pay | Admitting: *Deleted

## 2013-06-10 DIAGNOSIS — Z87891 Personal history of nicotine dependence: Secondary | ICD-10-CM | POA: Insufficient documentation

## 2013-06-10 DIAGNOSIS — Z8619 Personal history of other infectious and parasitic diseases: Secondary | ICD-10-CM | POA: Insufficient documentation

## 2013-06-10 DIAGNOSIS — Y9389 Activity, other specified: Secondary | ICD-10-CM | POA: Insufficient documentation

## 2013-06-10 DIAGNOSIS — S6990XA Unspecified injury of unspecified wrist, hand and finger(s), initial encounter: Secondary | ICD-10-CM | POA: Insufficient documentation

## 2013-06-10 DIAGNOSIS — Y929 Unspecified place or not applicable: Secondary | ICD-10-CM | POA: Insufficient documentation

## 2013-06-10 DIAGNOSIS — S46909A Unspecified injury of unspecified muscle, fascia and tendon at shoulder and upper arm level, unspecified arm, initial encounter: Secondary | ICD-10-CM | POA: Insufficient documentation

## 2013-06-10 DIAGNOSIS — S59909A Unspecified injury of unspecified elbow, initial encounter: Secondary | ICD-10-CM | POA: Insufficient documentation

## 2013-06-10 DIAGNOSIS — S4980XA Other specified injuries of shoulder and upper arm, unspecified arm, initial encounter: Secondary | ICD-10-CM | POA: Insufficient documentation

## 2013-06-10 DIAGNOSIS — G43909 Migraine, unspecified, not intractable, without status migrainosus: Secondary | ICD-10-CM | POA: Insufficient documentation

## 2013-06-10 MED ORDER — HYDROCODONE-ACETAMINOPHEN 5-325 MG PO TABS
1.0000 | ORAL_TABLET | Freq: Once | ORAL | Status: AC
  Administered 2013-06-10: 1 via ORAL
  Filled 2013-06-10: qty 1

## 2013-06-10 MED ORDER — IBUPROFEN 800 MG PO TABS: 800.0000 mg | ORAL_TABLET | Freq: Three times a day (TID) | ORAL | Status: DC

## 2013-06-10 MED ORDER — CYCLOBENZAPRINE HCL 10 MG PO TABS: 10.0000 mg | ORAL_TABLET | Freq: Two times a day (BID) | ORAL | Status: DC | PRN

## 2013-06-10 MED ORDER — HYDROCODONE-ACETAMINOPHEN 5-325 MG PO TABS: 1.0000 | ORAL_TABLET | ORAL | Status: DC | PRN
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
## 2013-06-14 NOTE — ED Provider Notes (Signed)
Medical screening examination/treatment/procedure(s) were performed by non-physician practitioner and as supervising physician I was immediately available for consultation/collaboration.    Fallon Haecker J. Charliee Krenz, MD 06/14/13 1554 

## 2013-06-23 ENCOUNTER — Emergency Department (HOSPITAL_COMMUNITY): Payer: BC Managed Care – PPO

## 2013-06-23 ENCOUNTER — Emergency Department (HOSPITAL_COMMUNITY)
Admission: EM | Admit: 2013-06-23 | Discharge: 2013-06-23 | Disposition: A | Discharge status: Home / Self Care | Payer: BC Managed Care – PPO | Attending: Emergency Medicine | Admitting: Emergency Medicine

## 2013-06-23 ENCOUNTER — Encounter (HOSPITAL_COMMUNITY): Payer: Self-pay | Admitting: Adult Health

## 2013-06-23 DIAGNOSIS — H538 Other visual disturbances: Secondary | ICD-10-CM | POA: Insufficient documentation

## 2013-06-23 DIAGNOSIS — Z8619 Personal history of other infectious and parasitic diseases: Secondary | ICD-10-CM | POA: Insufficient documentation

## 2013-06-23 DIAGNOSIS — Z87891 Personal history of nicotine dependence: Secondary | ICD-10-CM | POA: Insufficient documentation

## 2013-06-23 DIAGNOSIS — R51 Headache: Secondary | ICD-10-CM | POA: Insufficient documentation

## 2013-06-23 DIAGNOSIS — Z8679 Personal history of other diseases of the circulatory system: Secondary | ICD-10-CM | POA: Insufficient documentation

## 2013-06-23 DIAGNOSIS — R42 Dizziness and giddiness: Secondary | ICD-10-CM | POA: Insufficient documentation

## 2013-06-23 DIAGNOSIS — Z87828 Personal history of other (healed) physical injury and trauma: Secondary | ICD-10-CM | POA: Insufficient documentation

## 2013-06-23 MED ORDER — TRAMADOL HCL 50 MG PO TABS: 50.0000 mg | ORAL_TABLET | Freq: Four times a day (QID) | ORAL | Status: DC | PRN

## 2013-06-23 NOTE — ED Notes (Signed)
Patient transported to CT 

## 2013-06-23 NOTE — Discharge Instructions (Signed)
General Headache Without Cause A headache is pain or discomfort felt around the head or neck area. The specific cause of a headache may not be found. There are many causes and types of headaches. A few common ones are:  Tension headaches.  Migraine headaches.  Cluster headaches.  Chronic daily headaches. HOME CARE INSTRUCTIONS   Keep all follow-up appointments with your caregiver or any specialist referral.  Only take over-the-counter or prescription medicines for pain or discomfort as directed by your caregiver.  Lie down in a dark, quiet room when you have a headache.  Keep a headache journal to find out what may trigger your migraine headaches. For example, write down:  What you eat and drink.  How much sleep you get.  Any change to your diet or medicines.  Try massage or other relaxation techniques.  Put ice packs or heat on the head and neck. Use these 3 to 4 times per day for 15 to 20 minutes each time, or as needed.  Limit stress.  Sit up straight, and do not tense your muscles.  Quit smoking if you smoke.  Limit alcohol use.  Decrease the amount of caffeine you drink, or stop drinking caffeine.  Eat and sleep on a regular schedule.  Get 7 to 9 hours of sleep, or as recommended by your caregiver.  Keep lights dim if bright lights bother you and make your headaches worse. SEEK MEDICAL CARE IF:   You have problems with the medicines you were prescribed.  Your medicines are not working.  You have a change from the usual headache.  You have nausea or vomiting. SEEK IMMEDIATE MEDICAL CARE IF:   Your headache becomes severe.  You have a fever.  You have a stiff neck.  You have loss of vision.  You have muscular weakness or loss of muscle control.  You start losing your balance or have trouble walking.  You feel faint or pass out.  You have severe symptoms that are different from your first symptoms. MAKE SURE YOU:   Understand these  instructions.  Will watch your condition.  Will get help right away if you are not doing well or get worse. Document Released: 11/17/2005 Document Revised: 02/09/2012 Document Reviewed: 12/03/2011 Exit[employer] Patient Information 2014 [employer].  
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
## 2013-07-06 ENCOUNTER — Encounter: Payer: Self-pay | Admitting: Family Medicine

## 2013-07-06 ENCOUNTER — Ambulatory Visit (INDEPENDENT_AMBULATORY_CARE_PROVIDER_SITE_OTHER): Payer: BC Managed Care – PPO | Admitting: Family Medicine

## 2013-07-06 ENCOUNTER — Ambulatory Visit: Payer: BC Managed Care – PPO | Admitting: Family Medicine

## 2013-07-06 VITALS — BP 110/70 | HR 95 | Temp 98.3°F | Wt 163.0 lb

## 2013-07-06 DIAGNOSIS — J209 Acute bronchitis, unspecified: Secondary | ICD-10-CM

## 2013-07-06 MED ORDER — HYDROCODONE-HOMATROPINE 5-1.5 MG/5ML PO SYRP: 5.0000 mL | ORAL_SOLUTION | Freq: Four times a day (QID) | ORAL | Status: AC | PRN

## 2013-07-06 NOTE — Progress Notes (Signed)
  Subjective:    Patient ID: Danielle Gross, female    DOB: 03/25/1991, 22 y.o.   MRN: [id number]  HPI Acute visit Patient is nonsmoker seen with 2-3 day history of chest congestion and nasal congestion. She has a cough productive of yellow sputum. No history of asthma. No wheezing. She had occasional mild headache. She has taken NyQuil and DayQuil without much. Denies any sore throat. Minimal malaise.  Past Medical History  Diagnosis Date  . Headache(784.0)   . Chicken pox   . Migraines    No past surgical history on file.  reports that she has quit smoking. She does not have any smokeless tobacco history on file. She reports that  drinks alcohol. She reports that she does not use illicit drugs. family history includes Anemia in her other; Asthma in her brother; Clotting disorder in her other; Diabetes in her mother; Hyperlipidemia in her mother; and Hypertension in her mother.  There is no history of Stroke and Cancer. No Known Allergies    Review of Systems  Constitutional: Negative for fever and chills.  HENT: Positive for congestion. Negative for sore throat.   Respiratory: Positive for cough. Negative for shortness of breath and wheezing.   Cardiovascular: Negative for chest pain.  Neurological: Positive for headaches.       Objective:   Physical Exam  Constitutional: She appears well-developed and well-nourished.  HENT:  Right Ear: External ear normal.  Left Ear: External ear normal.  Mouth/Throat: Oropharynx is clear and moist.  Neck: Neck supple.  Cardiovascular: Normal rate and regular rhythm.   Pulmonary/Chest: Effort normal and breath sounds normal. No respiratory distress. She has no wheezes. She has no rales.  Lymphadenopathy:    She has no cervical adenopathy.          Assessment & Plan:  Acute bronchitis. Explained these are usually viral. Hycodan cough syrup for nighttime use as needed. Followup for any fever or change of symptoms

## 2013-07-06 NOTE — Patient Instructions (Addendum)
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
## 2014-06-05 ENCOUNTER — Encounter (HOSPITAL_COMMUNITY): Payer: Self-pay | Admitting: Emergency Medicine

## 2014-06-05 ENCOUNTER — Emergency Department (HOSPITAL_COMMUNITY)
Admission: EM | Admit: 2014-06-05 | Discharge: 2014-06-05 | Disposition: A | Discharge status: Home / Self Care | Payer: BC Managed Care – PPO | Attending: Emergency Medicine | Admitting: Emergency Medicine

## 2014-06-05 DIAGNOSIS — Z880 Allergy status to penicillin: Secondary | ICD-10-CM | POA: Insufficient documentation

## 2014-06-05 DIAGNOSIS — K089 Disorder of teeth and supporting structures, unspecified: Secondary | ICD-10-CM | POA: Insufficient documentation

## 2014-06-05 DIAGNOSIS — K0889 Other specified disorders of teeth and supporting structures: Secondary | ICD-10-CM

## 2014-06-05 DIAGNOSIS — Z87891 Personal history of nicotine dependence: Secondary | ICD-10-CM | POA: Insufficient documentation

## 2014-06-05 DIAGNOSIS — Z8679 Personal history of other diseases of the circulatory system: Secondary | ICD-10-CM | POA: Insufficient documentation

## 2014-06-05 DIAGNOSIS — Z8619 Personal history of other infectious and parasitic diseases: Secondary | ICD-10-CM | POA: Insufficient documentation

## 2014-06-05 MED ORDER — HYDROCODONE-ACETAMINOPHEN 5-325 MG PO TABS: 1.0000 | ORAL_TABLET | Freq: Four times a day (QID) | ORAL | Status: DC | PRN

## 2014-06-05 MED ORDER — CLINDAMYCIN HCL 150 MG PO CAPS: 300.0000 mg | ORAL_CAPSULE | Freq: Three times a day (TID) | ORAL | Status: DC

## 2014-06-05 MED ORDER — CLINDAMYCIN HCL 150 MG PO CAPS: 150.0000 mg | ORAL_CAPSULE | Freq: Four times a day (QID) | ORAL | Status: DC

## 2014-06-05 NOTE — ED Notes (Signed)
Present with 1 month of upper right molar dental pain. [facility] but is unable to afford to have tooth removed. Mild right facial swelling.

## 2014-06-05 NOTE — Discharge Instructions (Signed)

## 2014-06-05 NOTE — ED Provider Notes (Signed)
CSN: [id number]     Arrival date & time 06/05/14  2213 History  This chart was scribed for non-physician practitioner, Roxy Horsemanobert Garlin Batdorf, PA-C working with Linwood DibblesJon Knapp, MD by Luisa DagoPriscilla Tutu, ED scribe. This patient was seen in room TR10C/TR10C and the patient's care was started at 11:05 PM.     Chief Complaint  Patient presents with  . Dental Pain   The history is provided by the patient. No language interpreter was used.   HPI Comments: Danielle Gross is a 23 y.o. female who presents to the Emergency Department with chief complaint of worsening dental pain that started approximately 1 week ago. Pt states that she's been dealing with a tooth infection on and off, however she states that she doesn't have the financial means to be seen by a dentist. Reports going to [facility] were she was prescribed a 5 day prescription but she does not recall the name of the medicine she was given. Pt also reports taking Ibuprofen and Tylenol with no relief. She denies any fever, chills, nausea, SOB, emesis, SOB or diaphoresis.  Past Medical History  Diagnosis Date  . Headache(784.0)   . Chicken pox   . Migraines    History reviewed. No pertinent past surgical history. Family History  Problem Relation Age of Onset  . Diabetes Mother   . Hypertension Mother   . Hyperlipidemia Mother   . Asthma Brother   . Stroke Neg Hx   . Cancer Neg Hx   . Anemia Other   . Clotting disorder Other    History  Substance Use Topics  . Smoking status: Former Games developermoker  . Smokeless tobacco: Not on file  . Alcohol Use: Yes     Comment: occasional use   OB History   Grav Para Term Preterm Abortions TAB SAB Ect Mult Living                 Review of Systems  Constitutional: Negative for fever and chills.  HENT: Positive for dental problem.   Respiratory: Negative for shortness of breath.   Cardiovascular: Negative for chest pain.  Gastrointestinal: Negative for nausea, vomiting and abdominal pain.  Neurological:  Negative for headaches.      Allergies  Penicillins  Home Medications   Prior to Admission medications   Medication Sig Start Date End Date Taking? Authorizing Provider  traMADol (ULTRAM) 50 MG tablet Take 1 tablet (50 mg total) by mouth every 6 (six) hours as needed for pain. 06/23/13   Geoffery Lyonsouglas Delo, MD   BP 116/80  Pulse 108  Temp(Src) 98 F (36.7 C) (Oral)  Resp 18  Ht 5\' 1"  (1.549 m)  SpO2 98%  Physical Exam  Nursing note and vitals reviewed. Constitutional: She is oriented to person, place, and time. She appears well-developed and well-nourished.  HENT:  Head: Normocephalic and atraumatic.  Mouth/Throat:    Poor dentition throughout.  Affected tooth as diagrammed.  No signs of peritonsillar or tonsillar abscess.  No signs of gingival abscess. Oropharynx is clear and without exudates.  Uvula is midline.  Airway is intact. No signs of Ludwig's angina with palpation of oral and sublingual mucosa.   Eyes: Conjunctivae and EOM are normal.  Neck: Normal range of motion.  Cardiovascular: Normal rate.   Pulmonary/Chest: Effort normal.  Abdominal: She exhibits no distension.  Musculoskeletal: Normal range of motion.  Neurological: She is alert and oriented to person, place, and time.  Skin: Skin is dry.  Psychiatric: She has a normal mood  and affect. Her behavior is normal. Judgment and thought content normal.    ED Course  Procedures (including critical care time)  DIAGNOSTIC STUDIES: Oxygen Saturation is 98% on RA, normal by my interpretation.    COORDINATION OF CARE: 11:08 PM- Pt advised of plan for treatment and pt agrees. Will prescribe antibiotics. Will also give pt a referral to local dentist. Advised pt to alternate between Ibuprofen and Tylenol.   Labs Review Labs Reviewed - No data to display  Imaging Review No results found.   EKG Interpretation None      MDM   Final diagnoses:  Pain, dental    Patient with toothache.  No gross abscess.   Exam unconcerning for Ludwig's angina or spread of infection.  Will treat with clinda and pain medicine.  Urged patient to follow-up with dentist.     I personally performed the services described in this documentation, which was scribed in my presence. The recorded information has been reviewed and is accurate.    Roxy Horsemanobert Akshitha Culmer, PA-C 06/05/14 2319

## 2014-06-06 NOTE — ED Provider Notes (Signed)
Medical screening examination/treatment/procedure(s) were performed by non-physician practitioner and as supervising physician I was immediately available for consultation/collaboration.    Jaxiel Kines, MD 06/06/14 0034 

## 2014-06-13 ENCOUNTER — Encounter (HOSPITAL_COMMUNITY): Payer: Self-pay | Admitting: Emergency Medicine

## 2014-06-13 ENCOUNTER — Emergency Department (HOSPITAL_COMMUNITY)
Admission: EM | Admit: 2014-06-13 | Discharge: 2014-06-13 | Disposition: A | Discharge status: Home / Self Care | Payer: BC Managed Care – PPO | Attending: Emergency Medicine | Admitting: Emergency Medicine

## 2014-06-13 DIAGNOSIS — Z880 Allergy status to penicillin: Secondary | ICD-10-CM | POA: Insufficient documentation

## 2014-06-13 DIAGNOSIS — Z792 Long term (current) use of antibiotics: Secondary | ICD-10-CM | POA: Insufficient documentation

## 2014-06-13 DIAGNOSIS — Z8619 Personal history of other infectious and parasitic diseases: Secondary | ICD-10-CM | POA: Insufficient documentation

## 2014-06-13 DIAGNOSIS — Z87891 Personal history of nicotine dependence: Secondary | ICD-10-CM | POA: Insufficient documentation

## 2014-06-13 DIAGNOSIS — Z8679 Personal history of other diseases of the circulatory system: Secondary | ICD-10-CM | POA: Insufficient documentation

## 2014-06-13 DIAGNOSIS — B86 Scabies: Secondary | ICD-10-CM | POA: Insufficient documentation

## 2014-06-13 DIAGNOSIS — K089 Disorder of teeth and supporting structures, unspecified: Secondary | ICD-10-CM | POA: Insufficient documentation

## 2014-06-13 DIAGNOSIS — K0889 Other specified disorders of teeth and supporting structures: Secondary | ICD-10-CM

## 2014-06-13 MED ORDER — PERMETHRIN 5 % EX CREA: TOPICAL_CREAM | CUTANEOUS | Status: DC

## 2014-06-13 MED ORDER — HYDROCODONE-ACETAMINOPHEN 5-325 MG PO TABS: 2.0000 | ORAL_TABLET | ORAL | Status: DC | PRN

## 2014-06-13 NOTE — ED Provider Notes (Signed)
CSN: [id number]     Arrival date & time 06/13/14  1455 History  This chart was scribed for non-physician practitioner, Emilia BeckKaitlyn Arriel Victor, PA-C working with Raeford RazorStephen Kohut, MD by Greggory StallionKayla Andersen, ED scribe. This patient was seen in room TR09C/TR09C and the patient's care was started at 3:59 PM.   Chief Complaint  Patient presents with  . Rash  . Dental Pain   The history is provided by the patient. No language interpreter was used.   HPI Comments: Danielle Gross is a 23 y.o. female who presents to the Emergency Department complaining of an itching, burning rash to the webs of her fingers, palms and right side. States it started on her side a few days ago then spread to her hands yesterday. Pt has not slept anywhere new. Pt is also complaining of gradual onset right upper dental pain that started several days ago. States she recently finished clindamycin for her teeth. She tried to follow up with a dentist but couldn't afford the one she was referred to.   Past Medical History  Diagnosis Date  . Headache(784.0)   . Chicken pox   . Migraines    History reviewed. No pertinent past surgical history. Family History  Problem Relation Age of Onset  . Diabetes Mother   . Hypertension Mother   . Hyperlipidemia Mother   . Asthma Brother   . Stroke Neg Hx   . Cancer Neg Hx   . Anemia Other   . Clotting disorder Other    History  Substance Use Topics  . Smoking status: Former Games developermoker  . Smokeless tobacco: Not on file  . Alcohol Use: Yes     Comment: occasional use   OB History   Grav Para Term Preterm Abortions TAB SAB Ect Mult Living                 Review of Systems  HENT: Positive for dental problem.   Skin: Positive for rash.  All other systems reviewed and are negative.  Allergies  Penicillins  Home Medications   Prior to Admission medications   Medication Sig Start Date End Date Taking? Authorizing Provider  clindamycin (CLEOCIN) 150 MG capsule Take 2 capsules (300 mg  total) by mouth 3 (three) times daily. May dispense as 150mg  capsules 06/05/14   Roxy Horsemanobert Browning, PA-C  HYDROcodone-acetaminophen (NORCO/VICODIN) 5-325 MG per tablet Take 1-2 tablets by mouth every 6 (six) hours as needed for moderate pain or severe pain. 06/05/14   Roxy Horsemanobert Browning, PA-C  traMADol (ULTRAM) 50 MG tablet Take 1 tablet (50 mg total) by mouth every 6 (six) hours as needed for pain. 06/23/13   Geoffery Lyonsouglas Delo, MD   BP 126/87  Pulse 101  Temp(Src) 98 F (36.7 C) (Oral)  Resp 16  Ht 5\' 1"  (1.549 m)  Wt 145 lb (65.772 kg)  BMI 27.41 kg/m2  SpO2 100%  Physical Exam  Nursing note and vitals reviewed. Constitutional: She is oriented to person, place, and time. She appears well-developed and well-nourished. No distress.  HENT:  Head: Normocephalic and atraumatic.  Poor dentition. Right upper posterior molar tender to percussion. No facial swelling. No abscess.   Eyes: Conjunctivae and EOM are normal.  Neck: Neck supple. No tracheal deviation present.  Cardiovascular: Normal rate.   Pulmonary/Chest: Effort normal. No respiratory distress.  Musculoskeletal: Normal range of motion.  Neurological: She is alert and oriented to person, place, and time.  Skin: Skin is warm and dry.  Scattered papules on bilateral hands and  in finger webs. Also on right flank.   Psychiatric: She has a normal mood and affect. Her behavior is normal.    ED Course  Procedures (including critical care time)  DIAGNOSTIC STUDIES: Oxygen Saturation is 100% on RA, normal by my interpretation.    COORDINATION OF CARE: 4:01 PM-Discussed treatment plan which includes an antibiotic, pain medication and scabies cream with pt at bedside and pt agreed to plan.   Labs Review Labs Reviewed - No data to display  Imaging Review No results found.   EKG Interpretation None      MDM   Final diagnoses:  Scabies  Pain, dental    4:08 PM Patient will have permethrin for possible scabies. Patient will have  Vicodin and recommended dental follow up for dental pain. Patient instructed to return with worsening or concerning symptoms.  I personally performed the services described in this documentation, which was scribed in my presence. The recorded information has been reviewed and is accurate.  Emilia Beck, PA-C 06/13/14 1609

## 2014-06-13 NOTE — ED Notes (Signed)
Pt c/o dental pain to right upper back tooth x's 1 month.  Pt was put on antibiotic last week for same.

## 2014-06-13 NOTE — ED Notes (Signed)
Presents with right upper dental pain and rash to webs of fingers, palm and side c/o itching and burning with rash. Recently placed on antibiotics.

## 2014-06-13 NOTE — Discharge Instructions (Signed)
Follow up with the recommended dentist. Refer to attached documents for more information. Take Vicodin as needed for pain. Use Permethrin as directed.

## 2014-06-15 NOTE — ED Provider Notes (Signed)
Medical screening examination/treatment/procedure(s) were performed by non-physician practitioner and as supervising physician I was immediately available for consultation/collaboration.   EKG Interpretation None       Elianis Fischbach, MD 06/15/14 0731 

## 2014-07-31 ENCOUNTER — Emergency Department (HOSPITAL_COMMUNITY)
Admission: EM | Admit: 2014-07-31 | Discharge: 2014-07-31 | Disposition: A | Discharge status: Home / Self Care | Payer: BC Managed Care – PPO | Attending: Emergency Medicine | Admitting: Emergency Medicine

## 2014-07-31 ENCOUNTER — Emergency Department (HOSPITAL_COMMUNITY): Payer: BC Managed Care – PPO

## 2014-07-31 ENCOUNTER — Encounter (HOSPITAL_COMMUNITY): Payer: Self-pay | Admitting: Emergency Medicine

## 2014-07-31 DIAGNOSIS — Z8619 Personal history of other infectious and parasitic diseases: Secondary | ICD-10-CM | POA: Diagnosis not present

## 2014-07-31 DIAGNOSIS — Z3202 Encounter for pregnancy test, result negative: Secondary | ICD-10-CM | POA: Diagnosis not present

## 2014-07-31 DIAGNOSIS — R079 Chest pain, unspecified: Secondary | ICD-10-CM | POA: Diagnosis present

## 2014-07-31 DIAGNOSIS — Z880 Allergy status to penicillin: Secondary | ICD-10-CM | POA: Insufficient documentation

## 2014-07-31 DIAGNOSIS — Z8679 Personal history of other diseases of the circulatory system: Secondary | ICD-10-CM | POA: Insufficient documentation

## 2014-07-31 DIAGNOSIS — R110 Nausea: Secondary | ICD-10-CM | POA: Insufficient documentation

## 2014-07-31 LAB — I-STAT CHEM 8, ED
BUN: 11 mg/dL (ref 6–23)
Calcium, Ion: 1.2 mmol/L (ref 1.12–1.23)
Chloride: 109 mEq/L (ref 96–112)
Creatinine, Ser: 1.3 mg/dL — ABNORMAL HIGH (ref 0.50–1.10)
GLUCOSE: 93 mg/dL (ref 70–99)
HEMATOCRIT: 37 % (ref 36.0–46.0)
HEMOGLOBIN: 12.6 g/dL (ref 12.0–15.0)
POTASSIUM: 3.6 meq/L — AB (ref 3.7–5.3)
SODIUM: 138 meq/L (ref 137–147)
TCO2: 26 mmol/L (ref 0–100)

## 2014-07-31 LAB — I-STAT TROPONIN, ED: TROPONIN I, POC: 0 ng/mL (ref 0.00–0.08)

## 2014-07-31 LAB — PREGNANCY, URINE: PREG TEST UR: NEGATIVE

## 2014-07-31 MED ORDER — LORAZEPAM 1 MG PO TABS: 0.5000 mg | ORAL_TABLET | Freq: Once | ORAL | Status: DC

## 2014-07-31 MED ORDER — OMEPRAZOLE 20 MG PO CPDR: 20.0000 mg | DELAYED_RELEASE_CAPSULE | Freq: Every day | ORAL | Status: DC

## 2014-07-31 MED ORDER — GI COCKTAIL ~~LOC~~
30.0000 mL | Freq: Once | ORAL | Status: AC
  Administered 2014-07-31: 30 mL via ORAL
  Filled 2014-07-31: qty 30

## 2014-07-31 NOTE — ED Notes (Signed)
Patient here with complaint of chest pain which began about 30 minutes ago. States she was getting ready to go to sleep and the pain began. Explains that she tried to lay down, but that increased the pain. Endorses no history of similar pain. Currently rating pain 7/10 and points to epigastric area, describes "feels like a ball".

## 2014-07-31 NOTE — ED Provider Notes (Signed)
CSN: [id number]     Arrival date & time 07/31/14  0202 History   First MD Initiated Contact with Patient 07/31/14 0205     Chief Complaint  Patient presents with  . Chest Pain     (Consider location/radiation/quality/duration/timing/severity/associated sxs/prior Treatment) HPI Comments: Patient is a 23 year old female with no significant past medical history who presents to the emergency department for further evaluation of chest pain. Chest pain began while patient was trying to sleep. Patient describes the pain as a pressure located in her central chest. She denies any radiation of the pain. Pain, overall, has been improving spontaneously without intervention since onset at 0130. Patient endorses associated shortness of breath as well as nausea. She denies associated fever, diaphoresis, jaw pain or neck pain, syncope or near syncope, vomiting, abdominal pain, extremity numbness/tingling, extremity weakness. Patient denies a history of recent surgeries, hospitalizations, and travel. She denies the use of birth control. Patient denies a history of diabetes, hypertension, hyperlipidemia, and ACS. No personal or family history of tachyarrhythmias. No FHx of ACS.  Patient is a 23 y.o. female presenting with chest pain. The history is provided by the patient. No language interpreter was used.  Chest Pain Associated symptoms: nausea and shortness of breath   Associated symptoms: no palpitations     Past Medical History  Diagnosis Date  . Headache(784.0)   . Chicken pox   . Migraines    History reviewed. No pertinent past surgical history. Family History  Problem Relation Age of Onset  . Diabetes Mother   . Hypertension Mother   . Hyperlipidemia Mother   . Asthma Brother   . Stroke Neg Hx   . Cancer Neg Hx   . Anemia Other   . Clotting disorder Other    History  Substance Use Topics  . Smoking status: Former Games developer  . Smokeless tobacco: Not on file  . Alcohol Use: Yes     Comment:  occasional use   OB History   Grav Para Term Preterm Abortions TAB SAB Ect Mult Living                  Review of Systems  Respiratory: Positive for shortness of breath.   Cardiovascular: Positive for chest pain. Negative for palpitations.  Gastrointestinal: Positive for nausea.  All other systems reviewed and are negative.    Allergies  Penicillins  Home Medications   Prior to Admission medications   Medication Sig Start Date End Date Taking? Authorizing Provider  omeprazole (PRILOSEC) 20 MG capsule Take 1 capsule (20 mg total) by mouth daily. 07/31/14   Antony Madura, PA-C   BP 122/87  Pulse 72  Temp(Src) 97.6 F (36.4 C) (Oral)  Resp 14  SpO2 98%  LMP 07/31/2014  Physical Exam  Nursing note and vitals reviewed. Constitutional: She is oriented to person, place, and time. She appears well-developed and well-nourished. No distress.  Nontoxic/nonseptic appearing  HENT:  Head: Normocephalic and atraumatic.  Eyes: Conjunctivae and EOM are normal. No scleral icterus.  Neck: Normal range of motion.  Cardiovascular: Normal rate, regular rhythm, normal heart sounds and intact distal pulses.   No JVD  Pulmonary/Chest: Effort normal and breath sounds normal. No respiratory distress. She has no wheezes. She has no rales. She exhibits tenderness (mild TTP to anterior chest).  Chest expansion symmetric. No tachypnea or dyspnea.  Abdominal: Soft. She exhibits no distension. There is no tenderness. There is no rebound and no guarding.  Soft, nontender. No masses or peritoneal signs.  Musculoskeletal: Normal range of motion.  Neurological: She is alert and oriented to person, place, and time. She exhibits normal muscle tone. Coordination normal.  Skin: Skin is warm and dry. No rash noted. She is not diaphoretic. No erythema. No pallor.  Psychiatric: She has a normal mood and affect. Her behavior is normal.    ED Course  Procedures (including critical care time) Labs Review Labs  Reviewed  I-STAT CHEM 8, ED - Abnormal; Notable for the following:    Potassium 3.6 (*)    Creatinine, Ser 1.30 (*)    All other components within normal limits  PREGNANCY, URINE  I-STAT TROPOININ, ED    Imaging Review Dg Chest 2 View  07/31/2014   CLINICAL DATA:  Shortness of breath  EXAM: CHEST  2 VIEW  COMPARISON:  Prior radiograph from 06/10/2013  FINDINGS: The cardiac and mediastinal silhouettes are stable in size and contour, and remain within normal limits.  The lungs are normally inflated. No airspace consolidation, pleural effusion, or pulmonary edema is identified. There is no pneumothorax.  No acute osseous abnormality identified.  IMPRESSION: No active cardiopulmonary disease.   Electronically Signed   By: Rise Mu M.D.   On: 07/31/2014 02:41     EKG Interpretation   Date/Time:  Monday July 31 2014 02:08:01 EDT Ventricular Rate:  76 PR Interval:  136 QRS Duration: 88 QT Interval:  380 QTC Calculation: 427 R Axis:   44 Text Interpretation:  Normal sinus rhythm Nonspecific T wave abnormality  Abnormal ECG no prior for comparison Confirmed by HORTON  MD, Toni Amend  ([id number]) on 07/31/2014 2:12:24 AM      MDM   Final diagnoses:  Chest pain, unspecified chest pain type    23 year old female presents to the emergency department for further evaluation of chest pain. Chest pain described as pressure with associated shortness of breath and nausea. Patient today as well and nontoxic appearing, hemodynamically stable, and afebrile. Physical exam elicits no significant findings. EKG today is unremarkable. Doubt ACS given atypical nature of symptoms, reproducibility of pain, age, and lack of risk factors. Heart score 0. Chest x-ray today shows no evidence of focal consolidation, pneumonia, or pneumothorax. Doubt pulmonary embolism given lack of risk factors, tachycardia, or hypoxia; patient is PERC negative.  Patient treated in ED with GI cocktail with some improvement  in her chest pain/pressure. Symptoms may be secondary to esophageal reflux; however, anxiety also remains in the differential. Patient has no history of anxiety, therefore, will refrain from initiating medications. Advised patient followup with her primary care provider to further discuss her symptoms. Will start patient on Prilosec for potential reflux. Return precautions discussed and provided. Patient agreeable to plan with no unaddressed concerns.   Filed Vitals:   07/31/14 0330 07/31/14 0400 07/31/14 0415 07/31/14 0430  BP: 105/68  120/81 122/87  Pulse: 73 65 80 72  Temp:      TempSrc:      Resp: SpO2: 99% 99% 99% 98%     Antony Madura, PA-C 07/31/14 0505

## 2014-07-31 NOTE — ED Provider Notes (Signed)
Medical screening examination/treatment/procedure(s) were performed by non-physician practitioner and as supervising physician I was immediately available for consultation/collaboration.   EKG Interpretation   Date/Time:  Monday July 31 2014 02:08:01 EDT Ventricular Rate:  76 PR Interval:  136 QRS Duration: 88 QT Interval:  380 QTC Calculation: 427 R Axis:   44 Text Interpretation:  Normal sinus rhythm Nonspecific T wave abnormality  Abnormal ECG no prior for comparison Confirmed by Wilkie Aye  MD, Vallerie Hentz  ([id number]) on 07/31/2014 2:12:24 AM        Shon Baton, MD 07/31/14 [phone]

## 2014-07-31 NOTE — Discharge Instructions (Signed)
Your cardiac workup today has been unremarkable. Suspect that your symptoms may be due to anxiety or stress or acid reflux. Recommend you follow up with your primary care provider to discuss your symptoms further. In the interim, take Prilosec as prescribed. Return to the emergency department as needed if symptoms worsen.  Chest Pain (Nonspecific) It is often hard to give a specific diagnosis for the cause of chest pain. There is always a chance that your pain could be related to something serious, such as a heart attack or a blood clot in the lungs. You need to follow up with your health care provider for further evaluation. CAUSES   Heartburn.  Pneumonia or bronchitis.  Anxiety or stress.  Inflammation around your heart (pericarditis) or lung (pleuritis or pleurisy).  A blood clot in the lung.  A collapsed lung (pneumothorax). It can develop suddenly on its own (spontaneous pneumothorax) or from trauma to the chest.  Shingles infection (herpes zoster virus). The chest wall is composed of bones, muscles, and cartilage. Any of these can be the source of the pain.  The bones can be bruised by injury.  The muscles or cartilage can be strained by coughing or overwork.  The cartilage can be affected by inflammation and become sore (costochondritis). DIAGNOSIS  Lab tests or other studies may be needed to find the cause of your pain. Your health care provider may have you take a test called an ambulatory electrocardiogram (ECG). An ECG records your heartbeat patterns over a 24-hour period. You may also have other tests, such as:  Transthoracic echocardiogram (TTE). During echocardiography, sound waves are used to evaluate how blood flows through your heart.  Transesophageal echocardiogram (TEE).  Cardiac monitoring. This allows your health care provider to monitor your heart rate and rhythm in real time.  Holter monitor. This is a portable device that records your heartbeat and can help  diagnose heart arrhythmias. It allows your health care provider to track your heart activity for several days, if needed.  Stress tests by exercise or by giving medicine that makes the heart beat faster. TREATMENT   Treatment depends on what may be causing your chest pain. Treatment may include:  Acid blockers for heartburn.  Anti-inflammatory medicine.  Pain medicine for inflammatory conditions.  Antibiotics if an infection is present.  You may be advised to change lifestyle habits. This includes stopping smoking and avoiding alcohol, caffeine, and chocolate.  You may be advised to keep your head raised (elevated) when sleeping. This reduces the chance of acid going backward from your stomach into your esophagus. Most of the time, nonspecific chest pain will improve within 2-3 days with rest and mild pain medicine.  HOME CARE INSTRUCTIONS   If antibiotics were prescribed, take them as directed. Finish them even if you start to feel better.  For the next few days, avoid physical activities that bring on chest pain. Continue physical activities as directed.  Do not use any tobacco products, including cigarettes, chewing tobacco, or electronic cigarettes.  Avoid drinking alcohol.  Only take medicine as directed by your health care provider.  Follow your health care provider's suggestions for further testing if your chest pain does not go away.  Keep any follow-up appointments you made. If you do not go to an appointment, you could develop lasting (chronic) problems with pain. If there is any problem keeping an appointment, call to reschedule. SEEK MEDICAL CARE IF:   Your chest pain does not go away, even after treatment.  You have a rash with blisters on your chest.  You have a fever. SEEK IMMEDIATE MEDICAL CARE IF:   You have increased chest pain or pain that spreads to your arm, neck, jaw, back, or abdomen.  You have shortness of breath.  You have an increasing cough,  or you cough up blood.  You have severe back or abdominal pain.  You feel nauseous or vomit.  You have severe weakness.  You faint.  You have chills. This is an emergency. Do not wait to see if the pain will go away. Get medical help at once. Call your local emergency services (911 in [place] Do not drive yourself to the hospital. MAKE SURE YOU:   Understand these instructions.  Will watch your condition.  Will get help right away if you are not doing well or get worse. Document Released: 08/27/2005 Document Revised: 11/22/2013 Document Reviewed: 06/22/2008 [facility] Patient Information 2015 [place], [place]. This information is not intended to replace advice given to you by your health care provider. Make sure you discuss any questions you have with your health care provider.

## 2014-07-31 NOTE — ED Notes (Signed)
lmp now 

## 2014-07-31 NOTE — ED Notes (Signed)
Mid-chest pain just before going to bed. After the chest pain begain she started hjaving some sob.  Very tense at present she denies panic attacks or anxiety

## 2014-09-06 ENCOUNTER — Ambulatory Visit: Payer: BC Managed Care – PPO | Admitting: Family

## 2014-09-06 ENCOUNTER — Encounter: Payer: Self-pay | Admitting: Physician Assistant

## 2014-09-06 ENCOUNTER — Ambulatory Visit (INDEPENDENT_AMBULATORY_CARE_PROVIDER_SITE_OTHER): Payer: BC Managed Care – PPO | Admitting: Physician Assistant

## 2014-09-06 VITALS — BP 100/70 | HR 84 | Temp 97.4°F | Resp 18 | Wt 160.3 lb

## 2014-09-06 DIAGNOSIS — J069 Acute upper respiratory infection, unspecified: Secondary | ICD-10-CM

## 2014-09-06 NOTE — Patient Instructions (Addendum)
Plain Over the Counter Mucinex (NOT Mucinex D) for thick secretions  Try taking plain over the counter delsym to help with cough symptoms.  Force NON dairy fluids, drinking plenty of water is best.    Over the Counter Flonase OR Nasacort AQ 1 spray in each nostril twice a day as needed. Use the "crossover" technique into opposite nostril spraying toward opposite ear @ 45 degree angle, not straight up into nostril.   Plain Over the Counter Allegra (NOT D )  160 daily , OR Claritin 10 mg , OR Zyrtec 10 mg @ bedtime  as needed for itchy eyes & sneezing.  Saline Irrigation and Saline Sprays can also help reduce symptoms.  If emergency symptoms discussed during visit developed, seek medical attention immediately.  Followup as needed, or for worsening or persistent symptoms despite treatment.     Upper Respiratory Infection, Adult An upper respiratory infection (URI) is also known as the common cold. It is often caused by a type of germ (virus). Colds are easily spread (contagious). You can pass it to others by kissing, coughing, sneezing, or drinking out of the same glass. Usually, you get better in 1 or 2 weeks.  HOME CARE   Only take medicine as told by your doctor.  Use a warm mist humidifier or breathe in steam from a hot shower.  Drink enough water and fluids to keep your pee (urine) clear or pale yellow.  Get plenty of rest.  Return to work when your temperature is back to normal or as told by your doctor. You may use a face mask and wash your hands to stop your cold from spreading. GET HELP RIGHT AWAY IF:   After the first few days, you feel you are getting worse.  You have questions about your medicine.  You have chills, shortness of breath, or brown or red spit (mucus).  You have yellow or brown snot (nasal discharge) or pain in the face, especially when you bend forward.  You have a fever, puffy (swollen) neck, pain when you swallow, or white spots in the back of your  throat.  You have a bad headache, ear pain, sinus pain, or chest pain.  You have a high-pitched whistling sound when you breathe in and out (wheezing).  You have a lasting cough or cough up blood.  You have sore muscles or a stiff neck. MAKE SURE YOU:   Understand these instructions.  Will watch your condition.  Will get help right away if you are not doing well or get worse. Document Released: 05/05/2008 Document Revised: 02/09/2012 Document Reviewed: 02/22/2014 [facility]ExitCare Patient Information 2015 [place]. This information is not intended to replace advice given to you by your health care provider. Make sure you discuss any questions you have with your health care provider.

## 2014-09-06 NOTE — Progress Notes (Signed)
Subjective:    Patient ID: Danielle Gross, female    DOB: Apr 29, 1991, 23 y.o.   MRN: [id number]  Sinusitis This is a new problem. The current episode started in the past 7 days (6 days). The problem has been gradually worsening since onset. There has been no fever. She is experiencing no pain. Associated symptoms include congestion, coughing, ear pain, headaches, a hoarse voice, sinus pressure, sneezing and a sore throat. Pertinent negatives include no chills, diaphoresis, neck pain, shortness of breath or swollen glands. Treatments tried: ibuprofen, mucinex. The treatment provided mild relief.      Review of Systems  Constitutional: Negative for fever, chills and diaphoresis.  HENT: Positive for congestion, ear pain, hoarse voice, postnasal drip, sinus pressure, sneezing and sore throat.   Respiratory: Positive for cough. Negative for shortness of breath.   Cardiovascular: Negative for chest pain.  Gastrointestinal: Negative for nausea, vomiting and diarrhea.  Musculoskeletal: Negative for neck pain.  Neurological: Positive for headaches. Negative for syncope.  All other systems reviewed and are negative.    Past Medical History  Diagnosis Date  . Headache(784.0)   . Chicken pox   . Migraines     History   Social History  . Marital Status: Single    Spouse Name: N/A    Number of Children: N/A  . Years of Education: N/A   Occupational History  . Not on file.   Social History Main Topics  . Smoking status: Former Games developermoker  . Smokeless tobacco: Not on file  . Alcohol Use: Yes     Comment: occasional use  . Drug Use: No  . Sexual Activity: No   Other Topics Concern  . Not on file   Social History Narrative   Work or School: currently no working or going to school      Home Situation: lives with parents      Spiritual Beliefs: Christian      Lifestyle: walks dog daily, diet is ok             History reviewed. No pertinent past surgical history.  Family  History  Problem Relation Age of Onset  . Diabetes Mother   . Hypertension Mother   . Hyperlipidemia Mother   . Asthma Brother   . Stroke Neg Hx   . Cancer Neg Hx   . Anemia Other   . Clotting disorder Other     Allergies  Allergen Reactions  . Penicillins Hives    Current Outpatient Prescriptions on File Prior to Visit  Medication Sig Dispense Refill  . omeprazole (PRILOSEC) 20 MG capsule Take 1 capsule (20 mg total) by mouth daily.  30 capsule  0   No current facility-administered medications on file prior to visit.    EXAM: BP 100/70  Pulse 60  Temp(Src) 97.4 F (36.3 C) (Oral)  Resp 18  Wt 160 lb 4.8 oz (72.712 kg)  LMP 08/28/2014     Objective:   Physical Exam  Nursing note and vitals reviewed. Constitutional: She is oriented to person, place, and time. She appears well-developed and well-nourished. No distress.  HENT:  Head: Normocephalic and atraumatic.  Right Ear: External ear normal.  Left Ear: External ear normal.  Nose: Nose normal.  Mouth/Throat: No oropharyngeal exudate.  Oropharynx is slightly erythematous, no exudate. Bilateral TMs normal. Bilateral frontal and maxillary sinuses non-TTP.  Eyes: Conjunctivae and EOM are normal.  Neck: Normal range of motion. Neck supple.  Cardiovascular: Normal rate, regular rhythm and intact  distal pulses.   Pulmonary/Chest: Effort normal and breath sounds normal. No stridor. No respiratory distress. She has no wheezes. She has no rales. She exhibits no tenderness.  Lymphadenopathy:    She has no cervical adenopathy.  Neurological: She is alert and oriented to person, place, and time.  Skin: Skin is warm and dry. No rash noted. She is not diaphoretic. No erythema. No pallor.  Psychiatric: She has a normal mood and affect. Her behavior is normal. Judgment and thought content normal.     Lab Results  Component Value Date   WBC 7.9 02/27/2011   HGB 12.6 07/31/2014   HCT 37.0 07/31/2014   PLT 304 02/27/2011    GLUCOSE 93 07/31/2014   ALT <8 02/27/2011   AST 11 02/27/2011   NA 138 07/31/2014   K 3.6* 07/31/2014   CL 109 07/31/2014   CREATININE 1.30* 07/31/2014   BUN 11 07/31/2014   CO2 26 02/27/2011   TSH 0.64 02/13/2011   INR 0.90* 02/27/2011        Assessment & Plan:  Danielle Gross was seen today for cough.  Diagnoses and associated orders for this visit:  Acute upper respiratory infection Comments: Symptomatic treatment with OTC Mucinex, Delsym, nasal steroid, antihistamine, rest, push fluids, watchful waiting.    Return precautions provided, and patient handout on URI.  Plan to follow up as needed, or for worsening or persistent symptoms despite treatment.  Patient Instructions  Plain Over the Counter Mucinex (NOT Mucinex D) for thick secretions  Try taking plain over the counter delsym to help with cough symptoms.  Force NON dairy fluids, drinking plenty of water is best.    Over the Counter Flonase OR Nasacort AQ 1 spray in each nostril twice a day as needed. Use the "crossover" technique into opposite nostril spraying toward opposite ear @ 45 degree angle, not straight up into nostril.   Plain Over the Counter Allegra (NOT D )  160 daily , OR Claritin 10 mg , OR Zyrtec 10 mg @ bedtime  as needed for itchy eyes & sneezing.  Saline Irrigation and Saline Sprays can also help reduce symptoms.  If emergency symptoms discussed during visit developed, seek medical attention immediately.  Followup as needed, or for worsening or persistent symptoms despite treatment.

## 2014-09-08 ENCOUNTER — Telehealth: Payer: Self-pay | Admitting: Internal Medicine

## 2014-09-08 ENCOUNTER — Telehealth: Payer: Self-pay | Admitting: Physician Assistant

## 2014-09-08 DIAGNOSIS — J019 Acute sinusitis, unspecified: Secondary | ICD-10-CM

## 2014-09-08 MED ORDER — CEPHALEXIN 500 MG PO CAPS: 500.0000 mg | ORAL_CAPSULE | Freq: Three times a day (TID) | ORAL | Status: DC

## 2014-09-08 NOTE — Telephone Encounter (Signed)
Please advise.//AB/CMA 

## 2014-09-08 NOTE — Telephone Encounter (Signed)
Pt was seen on 10/7 for uri and was told if no better to callback for ?abx. Pt is no better [facility] [address]

## 2014-09-08 NOTE — Telephone Encounter (Signed)
Called pt. Symptoms now sound like acute sinusitis with dental pain and nasal congestion. Minimal cough. Will send Keflex as pt is cash pay and pcn allergic(hives).

## 2014-09-08 NOTE — Telephone Encounter (Signed)
Called pt, sounds like acute sinusitis. I sent Keflex in to her pharmacy. She is aware.

## 2015-05-07 IMAGING — CR DG CHEST 2V
2 series · 2 of 2 positions shown · non-contrast
Comparison: Prior radiograph from 06/10/2013

CLINICAL DATA: Shortness of breath

EXAM:
CHEST  2 VIEW

[w chest pa]
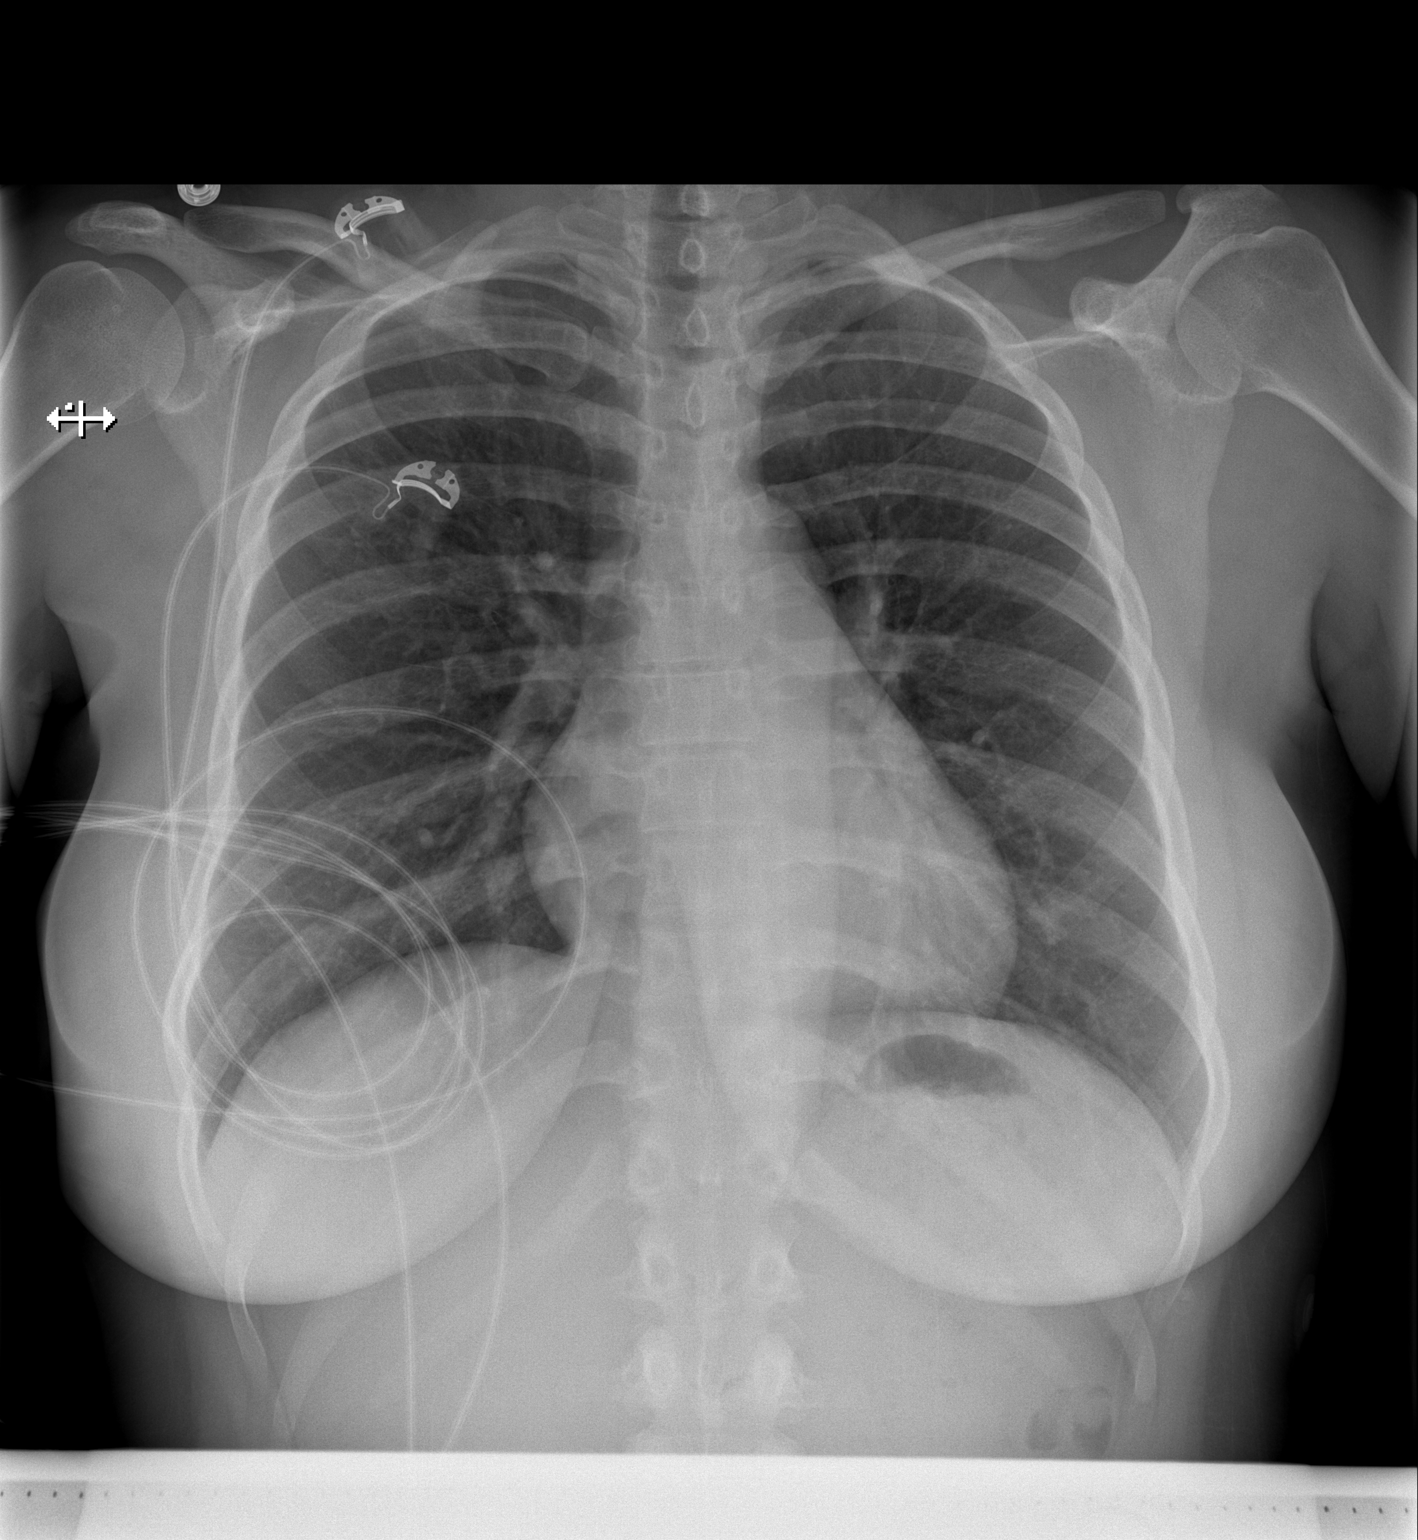

[w chest lat]
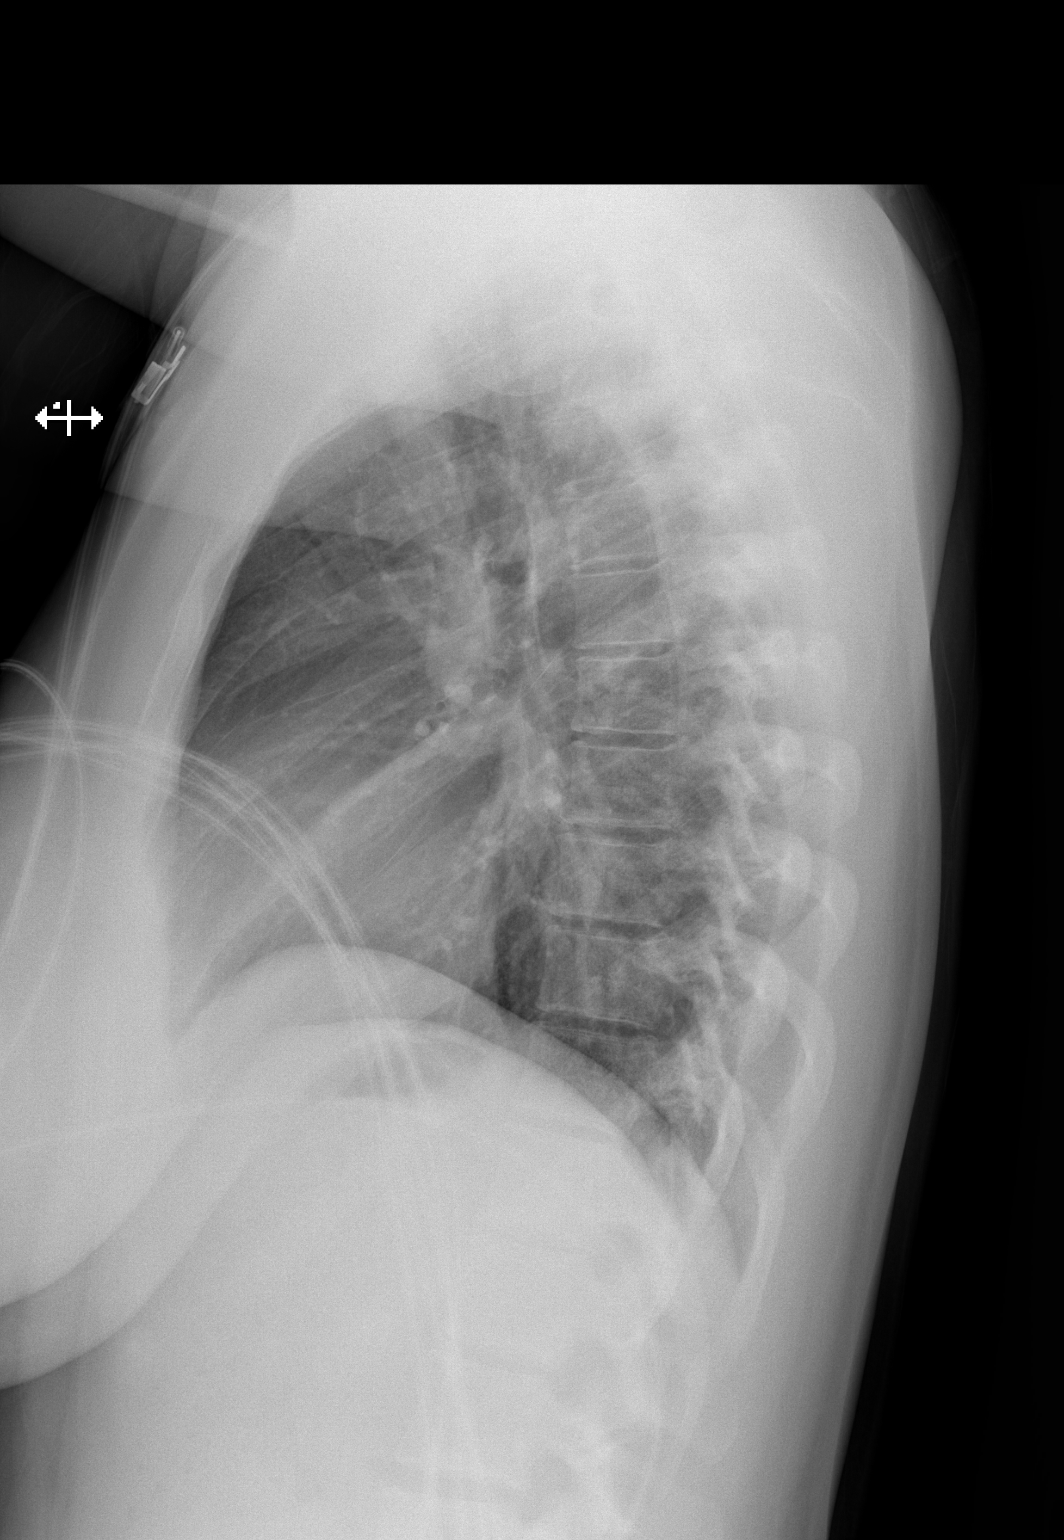

[2 of 2 positions shown; findings below may reference images not displayed]

FINDINGS: The cardiac and mediastinal silhouettes are stable in size and
contour, and remain within normal limits.

The lungs are normally inflated. No airspace consolidation, pleural
effusion, or pulmonary edema is identified. There is no
pneumothorax.

No acute osseous abnormality identified.
IMPRESSION: No active cardiopulmonary disease.

## 2015-05-16 ENCOUNTER — Ambulatory Visit (INDEPENDENT_AMBULATORY_CARE_PROVIDER_SITE_OTHER): Payer: BC Managed Care – PPO | Admitting: Adult Health

## 2015-05-16 ENCOUNTER — Encounter: Payer: Self-pay | Admitting: Adult Health

## 2015-05-16 VITALS — BP 110/78 | Temp 98.9°F | Ht 61.0 in | Wt 165.9 lb

## 2015-05-16 DIAGNOSIS — K219 Gastro-esophageal reflux disease without esophagitis: Secondary | ICD-10-CM | POA: Diagnosis not present

## 2015-05-16 MED ORDER — OMEPRAZOLE 20 MG PO CPDR: 20.0000 mg | DELAYED_RELEASE_CAPSULE | Freq: Every day | ORAL | Status: DC

## 2015-05-16 NOTE — Patient Instructions (Addendum)
I have sent a prescription for Prilosec into the pharmacy. Check the store brand price, it may be cheaper. Take this every day for a month. Limit your spicy and acidic food intake and let me know your symptoms have not improved.   Food Choices for Gastroesophageal Reflux Disease When you have gastroesophageal reflux disease (GERD), the foods you eat and your eating habits are very important. Choosing the right foods can help ease the discomfort of GERD. WHAT GENERAL GUIDELINES DO I NEED TO FOLLOW?  Choose fruits, vegetables, whole grains, low-fat dairy products, and low-fat meat, fish, and poultry.  Limit fats such as oils, salad dressings, butter, nuts, and avocado.  Keep a food diary to identify foods that cause symptoms.  Avoid foods that cause reflux. These may be different for different people.  Eat frequent small meals instead of three large meals each day.  Eat your meals slowly, in a relaxed setting.  Limit fried foods.  Cook foods using methods other than frying.  Avoid drinking alcohol.  Avoid drinking large amounts of liquids with your meals.  Avoid bending over or lying down until 2-3 hours after eating. WHAT FOODS ARE NOT RECOMMENDED? The following are some foods and drinks that may worsen your symptoms: Vegetables Tomatoes. Tomato juice. Tomato and spaghetti sauce. Chili peppers. Onion and garlic. Horseradish. Fruits Oranges, grapefruit, and lemon (fruit and juice). Meats High-fat meats, fish, and poultry. This includes hot dogs, ribs, ham, sausage, salami, and bacon. Dairy Whole milk and chocolate milk. Sour cream. Cream. Butter. Ice cream. Cream cheese.  Beverages Coffee and tea, with or without caffeine. Carbonated beverages or energy drinks. Condiments Hot sauce. Barbecue sauce.  Sweets/Desserts Chocolate and cocoa. Donuts. Peppermint and spearmint. Fats and Oils High-fat foods, including [place] fries and potato chips. Other Vinegar. Strong spices, such  as black pepper, white pepper, red pepper, cayenne, curry powder, cloves, ginger, and chili powder. The items listed above may not be a complete list of foods and beverages to avoid. Contact your dietitian for more information. Document Released: 11/17/2005 Document Revised: 11/22/2013 Document Reviewed: 09/21/2013 [facility] Patient Information 2015 [employer], [place]. This information is not intended to replace advice given to you by your health care provider. Make sure you discuss any questions you have with your health care provider.

## 2015-05-16 NOTE — Progress Notes (Signed)
Pre visit review using our clinic review tool, if applicable. No additional management support is needed unless otherwise documented below in the visit note. 

## 2015-05-16 NOTE — Progress Notes (Signed)
Subjective:    Patient ID: Danielle Gross, female    DOB: [birth year], 24 y.o.   MRN: [id number]  HPI  Danielle Gross presents to the office today for three weeks of acid reflux type symptoms. She endorses bloated hard stomach, constant belching, and acid like taste in her mouth at night. Has tried OTC tums without any relief. She has tried Prilosec but does not know how it works.   She also has gel like stools and has been constipated recently.   She endorses eating fruit, chicken, vegetables. Sometimes has fried foods and eats a lot of pizza. Drinks juice and water, no soda. About 3-4 bottles of water. She usually meets her 10,000 step goal per day  Occasional nausea and vomiting.   Review of Systems  Constitutional: Negative.   HENT: Negative.   Respiratory: Negative.   Gastrointestinal: Positive for nausea, vomiting, abdominal pain and constipation. Negative for diarrhea, blood in stool, abdominal distention, anal bleeding and rectal pain.  Endocrine: Negative.   Genitourinary: Negative.   Musculoskeletal: Negative.   Neurological: Negative.   Hematological: Negative.   Psychiatric/Behavioral: Negative.   All other systems reviewed and are negative.  Past Medical History  Diagnosis Date  . Headache(784.0)   . Chicken pox   . Migraines     History   Social History  . Marital Status: Single    Spouse Name: N/A  . Number of Children: N/A  . Years of Education: N/A   Occupational History  . Not on file.   Social History Main Topics  . Smoking status: Former Games developer  . Smokeless tobacco: Not on file  . Alcohol Use: Yes     Comment: occasional use  . Drug Use: No  . Sexual Activity: No   Other Topics Concern  . Not on file   Social History Narrative   Work or School: currently no working or going to school      Home Situation: lives with parents      Spiritual Beliefs: Christian      Lifestyle: walks dog daily, diet is ok             No past surgical  history on file.  Family History  Problem Relation Age of Onset  . Diabetes Mother   . Hypertension Mother   . Hyperlipidemia Mother   . Asthma Brother   . Stroke Neg Hx   . Cancer Neg Hx   . Anemia Other   . Clotting disorder Other     Allergies  Allergen Reactions  . Penicillins Hives    No current outpatient prescriptions on file prior to visit.   No current facility-administered medications on file prior to visit.    BP 110/78 mmHg  Temp(Src) 98.9 F (37.2 C) (Oral)  Ht  (1.549 m)  Wt 165 lb 14.4 oz (75.252 kg)  BMI 31.36 kg/m2  LMP 05/16/2015       Objective:   Physical Exam  Constitutional: She is oriented to person, place, and time. She appears well-developed and well-nourished. No distress.  Eyes: Right eye exhibits no discharge. Left eye exhibits no discharge.  Cardiovascular: Normal rate, regular rhythm, normal heart sounds and intact distal pulses.  Exam reveals no gallop and no friction rub.   No murmur heard. Pulmonary/Chest: Effort normal and breath sounds normal. No respiratory distress. She has no wheezes. She has no rales. She exhibits no tenderness.  Abdominal: Soft. Bowel sounds are normal. She exhibits no distension and no  mass. There is no tenderness. There is no rebound and no guarding.  Lymphadenopathy:    She has no cervical adenopathy.  Neurological: She is alert and oriented to person, place, and time. No cranial nerve deficit. Coordination normal.  Skin: Skin is warm and dry. No rash noted. She is not diaphoretic. No erythema. No pallor.  Psychiatric: She has a normal mood and affect. Her behavior is normal. Judgment and thought content normal.  Nursing note and vitals reviewed.     Assessment & Plan:  1. Gastroesophageal reflux disease without esophagitis - Benign exam, likely from dietary choices - omeprazole (PRILOSEC) 20 MG capsule; Take 1 capsule (20 mg total) by mouth daily.  Dispense: 30 capsule; Refill: 0 - Can add fiber  to diet and drink 8 glasses of water a day for constipation.  - Follow up if no improvement in 1-2 weeks.

## 2015-08-21 ENCOUNTER — Encounter (HOSPITAL_COMMUNITY): Payer: Self-pay | Admitting: Emergency Medicine

## 2015-08-21 ENCOUNTER — Emergency Department (HOSPITAL_COMMUNITY)
Admission: EM | Admit: 2015-08-21 | Discharge: 2015-08-21 | Discharge status: Against Medical Advice | Payer: BC Managed Care – PPO | Attending: Emergency Medicine | Admitting: Emergency Medicine

## 2015-08-21 DIAGNOSIS — Z5321 Procedure and treatment not carried out due to patient leaving prior to being seen by health care provider: Secondary | ICD-10-CM | POA: Diagnosis not present

## 2015-08-21 DIAGNOSIS — R079 Chest pain, unspecified: Secondary | ICD-10-CM | POA: Diagnosis present

## 2015-08-21 LAB — BASIC METABOLIC PANEL
Anion gap: 8 (ref 5–15)
BUN: 13 mg/dL (ref 6–20)
CALCIUM: 9.7 mg/dL (ref 8.9–10.3)
CO2: 27 mmol/L (ref 22–32)
CREATININE: 0.68 mg/dL (ref 0.44–1.00)
Chloride: 106 mmol/L (ref 101–111)
GFR calc non Af Amer: >60 mL/min (ref >60)
Glucose, Bld: 98 mg/dL (ref 65–99)
Potassium: 4.7 mmol/L (ref 3.5–5.1)
SODIUM: 141 mmol/L (ref 135–145)

## 2015-08-21 LAB — I-STAT TROPONIN, ED: Troponin i, poc: 0 ng/mL (ref 0.00–0.08)

## 2015-08-21 LAB — CBC
HCT: 37.9 % (ref 36.0–46.0)
Hemoglobin: 12.4 g/dL (ref 12.0–15.0)
MCH: 29.4 pg (ref 26.0–34.0)
MCHC: 32.7 g/dL (ref 30.0–36.0)
MCV: 89.8 fL (ref 78.0–100.0)
PLATELETS: 263 10*3/uL (ref 150–400)
RBC: 4.22 MIL/uL (ref 3.87–5.11)
RDW: 13.7 % (ref 11.5–15.5)
WBC: 8.7 10*3/uL (ref 4.0–10.5)

## 2015-08-21 LAB — I-STAT BETA HCG BLOOD, ED (MC, WL, AP ONLY)

## 2015-08-21 NOTE — ED Notes (Signed)
Pt called for triage no answer  °

## 2015-08-21 NOTE — ED Notes (Signed)
Pt sts mid sternal CP starting today; pt sts some HA with hx of same

## 2015-08-21 NOTE — ED Provider Notes (Signed)
Patient not in room.  Nursing staff unable to locate patient.  Apparently left without being seen.  Felicie Morn, NP 08/21/15 [id number]  Pricilla Loveless, MD 08/24/15 [phone]

## 2015-08-21 NOTE — ED Notes (Signed)
Pt has been called several times for Fast track and Rad but no answer

## 2015-08-21 NOTE — ED Notes (Signed)
Patient called for room again with no answer

## 2016-10-01 ENCOUNTER — Ambulatory Visit: Payer: BC Managed Care – PPO | Admitting: Adult Health

## 2016-12-03 ENCOUNTER — Ambulatory Visit (INDEPENDENT_AMBULATORY_CARE_PROVIDER_SITE_OTHER): Payer: Self-pay | Admitting: Adult Health

## 2016-12-03 DIAGNOSIS — Z0289 Encounter for other administrative examinations: Secondary | ICD-10-CM

## 2017-03-29 ENCOUNTER — Encounter (HOSPITAL_COMMUNITY): Payer: Self-pay | Admitting: Emergency Medicine

## 2017-03-29 ENCOUNTER — Emergency Department (HOSPITAL_COMMUNITY)
Admission: EM | Admit: 2017-03-29 | Discharge: 2017-03-29 | Disposition: A | Discharge status: Home / Self Care | Payer: 59 | Attending: Emergency Medicine | Admitting: Emergency Medicine

## 2017-03-29 DIAGNOSIS — Z87891 Personal history of nicotine dependence: Secondary | ICD-10-CM | POA: Diagnosis not present

## 2017-03-29 DIAGNOSIS — Y999 Unspecified external cause status: Secondary | ICD-10-CM | POA: Diagnosis not present

## 2017-03-29 DIAGNOSIS — R252 Cramp and spasm: Secondary | ICD-10-CM

## 2017-03-29 DIAGNOSIS — X58XXXA Exposure to other specified factors, initial encounter: Secondary | ICD-10-CM | POA: Diagnosis not present

## 2017-03-29 DIAGNOSIS — S7011XA Contusion of right thigh, initial encounter: Secondary | ICD-10-CM | POA: Diagnosis not present

## 2017-03-29 DIAGNOSIS — Y939 Activity, unspecified: Secondary | ICD-10-CM | POA: Insufficient documentation

## 2017-03-29 DIAGNOSIS — S79921A Unspecified injury of right thigh, initial encounter: Secondary | ICD-10-CM | POA: Diagnosis present

## 2017-03-29 DIAGNOSIS — Y929 Unspecified place or not applicable: Secondary | ICD-10-CM | POA: Diagnosis not present

## 2017-03-29 LAB — I-STAT CHEM 8, ED
BUN: 12 mg/dL (ref 6–20)
CHLORIDE: 106 mmol/L (ref 101–111)
CREATININE: 1.4 mg/dL — AB (ref 0.44–1.00)
Calcium, Ion: 1.11 mmol/L — ABNORMAL LOW (ref 1.15–1.40)
GLUCOSE: 112 mg/dL — AB (ref 65–99)
HCT: 38 % (ref 36.0–46.0)
Hemoglobin: 12.9 g/dL (ref 12.0–15.0)
POTASSIUM: 3.8 mmol/L (ref 3.5–5.1)
Sodium: 138 mmol/L (ref 135–145)
TCO2: 26 mmol/L (ref 0–100)

## 2017-03-29 LAB — D-DIMER, QUANTITATIVE: D-Dimer, Quant: <0.27 ug/mL-FEU (ref 0.00–0.50)

## 2017-03-29 NOTE — ED Triage Notes (Signed)
Pt. Stated, My rt. Leg has been hurting for 2 days.  Pt. Has a bruised area on her outer middle thigh.

## 2017-03-29 NOTE — ED Provider Notes (Signed)
[facility]-EM[facility]Y DEPT Provider Note   By signing my name below, I, Earmon Phoenix, attest that this documentation has been prepared under the direction and in the presence of Derwood Kaplan, MD. Electronically Signed: Earmon Phoenix, ED Scribe. 03/29/17. 7:30 PM.    History   Chief Complaint Chief Complaint  Patient presents with  . Leg Pain   The history is provided by the patient and medical records. No language interpreter was used.    Danielle Gross is a 26 y.o. female who presents to the Emergency Department complaining of RLE cramping that began two days ago. She states the pain starts at the posterior calf and radiates up to the thigh. She reports associated swelling of the right lateral thigh and bruising to the area as well. She has not taken anything for pain. Touching the area increases the pain. She denies alleviating factors. She denies any recent travel or surgery. She denies h/o cancer. She denies any known trauma, injury or fall. She denies bruising or bleeding easily. Her LMP was at the beginning of the month and was normal.   Past Medical History:  Diagnosis Date  . Chicken pox   . Headache(784.0)   . Migraines     Patient Active Problem List   Diagnosis Date Noted  . Abnormal pap - followed by Dr. Hollice Espy at [facility] 02/28/2013  . COAGULOPATHY workup in 2013, per pt done after abnormal bleeding with procedure for abnormal pap and all normal 02/13/2011  . HEMORRHAGE COMPLICATING A PROCEDURE NEC 02/13/2011  . HEADACHE 07/10/2008    History reviewed. No pertinent surgical history.  OB History    No data available       Home Medications    Prior to Admission medications   Medication Sig Start Date End Date Taking? Authorizing Provider  omeprazole (PRILOSEC) 20 MG capsule Take 1 capsule (20 mg total) by mouth daily. 05/16/15   Shirline Frees, NP    Family History Family History  Problem Relation Age of Onset  . Diabetes Mother   .  Hypertension Mother   . Hyperlipidemia Mother   . Asthma Brother   . Anemia Other   . Clotting disorder Other   . Stroke Neg Hx   . Cancer Neg Hx     Social History Social History  Substance Use Topics  . Smoking status: Former Games developer  . Smokeless tobacco: Former Neurosurgeon  . Alcohol use Yes     Comment: occasional use     Allergies   Penicillins   Review of Systems Review of Systems  Musculoskeletal: Positive for myalgias.  Skin: Positive for color change.  Hematological: Does not bruise/bleed easily.    Physical Exam Updated Vital Signs BP (!) 138/116 (BP Location: Left Arm)   Pulse 94   Temp 98.8 F (37.1 C)   Resp 16   Ht  (1.575 m)   Wt 179 lb (81.2 kg)   LMP 03/02/2017   SpO2 98%   BMI 32.74 kg/m   Physical Exam  Constitutional: She is oriented to person, place, and time. She appears well-developed and well-nourished.  HENT:  Head: Normocephalic.  Eyes: EOM are normal.  Neck: Normal range of motion.  Pulmonary/Chest: Effort normal.  Abdominal: She exhibits no distension.  Musculoskeletal: Normal range of motion.  6 x 4 cm area of ecchymosis which is red and green in color over distal right thigh. Tenderness to calf muscle of RLE. No unilateral swelling or pitting edema.  Neurological: She is  alert and oriented to person, place, and time.  Psychiatric: She has a normal mood and affect.  Nursing note and vitals reviewed.    ED Treatments / Results  DIAGNOSTIC STUDIES: Oxygen Saturation is 98% on RA, normal by my interpretation.   COORDINATION OF CARE: 7:26 PM- Will order d-dimer. Pt verbalizes understanding and agrees to plan.  Medications - No data to display  Labs (all labs ordered are listed, but only abnormal results are displayed) Labs Reviewed  I-STAT CHEM 8, ED - Abnormal; Notable for the following:       Result Value   Creatinine, Ser 1.40 (*)    Glucose, Bld 112 (*)    Calcium, Ion 1.11 (*)    All other components within normal  limits  D-DIMER, QUANTITATIVE (NOT AT [facility])    EKG  EKG Interpretation None       Radiology No results found.  Procedures Procedures (including critical care time)  Medications Ordered in ED Medications - No data to display   Initial Impression / Assessment and Plan / ED Course  I have reviewed the triage vital signs and the nursing notes.  Pertinent labs & imaging results that were available during my care of the patient were reviewed by me and considered in my medical decision making (see chart for details).     I personally performed the services described in this documentation, which was scribed in my presence. The recorded information has been reviewed and is accurate.  Pt comes in with cc of leg pain x 2 weeks. Dimer ordered. WELLS score is 1 - dimer ordered. Pt also has bruise to the R thigh - however, she doesn't recall trauma. Pt also has no bleeding dyscrasia.   Final Clinical Impressions(s) / ED Diagnoses   Final diagnoses:  Contusion of right thigh, initial encounter  Leg cramping    New Prescriptions New Prescriptions   No medications on file    I personally performed the services described in this documentation, which was scribed in my presence. The recorded information has been reviewed and is accurate.      Derwood Kaplan, MD 03/29/17 2055

## 2017-03-29 NOTE — Discharge Instructions (Signed)
All the results in the ER are normal, labs and imaging. We are not sure what is causing your symptoms. The workup in the ER is not complete, and is limited to screening for life threatening and emergent conditions only, so please see a primary care doctor for further evaluation.  

## 2017-04-12 ENCOUNTER — Other Ambulatory Visit: Payer: Self-pay

## 2017-04-12 ENCOUNTER — Emergency Department (HOSPITAL_COMMUNITY)
Admission: EM | Admit: 2017-04-12 | Discharge: 2017-04-12 | Disposition: A | Discharge status: Home / Self Care | Payer: 59 | Attending: Emergency Medicine | Admitting: Emergency Medicine

## 2017-04-12 ENCOUNTER — Encounter (HOSPITAL_COMMUNITY): Payer: Self-pay | Admitting: Emergency Medicine

## 2017-04-12 DIAGNOSIS — Z87891 Personal history of nicotine dependence: Secondary | ICD-10-CM | POA: Insufficient documentation

## 2017-04-12 DIAGNOSIS — R519 Headache, unspecified: Secondary | ICD-10-CM

## 2017-04-12 DIAGNOSIS — R51 Headache: Secondary | ICD-10-CM | POA: Insufficient documentation

## 2017-04-12 DIAGNOSIS — Z79899 Other long term (current) drug therapy: Secondary | ICD-10-CM | POA: Insufficient documentation

## 2017-04-12 LAB — URINALYSIS, ROUTINE W REFLEX MICROSCOPIC
Bilirubin Urine: NEGATIVE
GLUCOSE, UA: NEGATIVE mg/dL
Hgb urine dipstick: NEGATIVE
KETONES UR: 20 mg/dL — AB
LEUKOCYTES UA: NEGATIVE
Nitrite: NEGATIVE
PH: 5 (ref 5.0–8.0)
PROTEIN: 30 mg/dL — AB
Specific Gravity, Urine: 1.029 (ref 1.005–1.030)

## 2017-04-12 MED ORDER — DIPHENHYDRAMINE HCL 50 MG/ML IJ SOLN
25.0000 mg | Freq: Once | INTRAMUSCULAR | Status: AC
  Administered 2017-04-12: 25 mg via INTRAVENOUS
  Filled 2017-04-12: qty 1

## 2017-04-12 MED ORDER — TRAMADOL HCL 50 MG PO TABS: 50.0000 mg | ORAL_TABLET | Freq: Four times a day (QID) | ORAL | 0 refills | Status: AC | PRN

## 2017-04-12 MED ORDER — KETOROLAC TROMETHAMINE 30 MG/ML IJ SOLN
30.0000 mg | Freq: Once | INTRAMUSCULAR | Status: AC
  Administered 2017-04-12: 30 mg via INTRAVENOUS
  Filled 2017-04-12: qty 1

## 2017-04-12 MED ORDER — METOCLOPRAMIDE HCL 5 MG/ML IJ SOLN
10.0000 mg | Freq: Once | INTRAMUSCULAR | Status: AC
  Administered 2017-04-12: 10 mg via INTRAVENOUS
  Filled 2017-04-12: qty 2

## 2017-04-12 NOTE — ED Provider Notes (Signed)
[facility] Provider Note   CSN: [id number] Arrival date & time: 04/12/17  1613     History   Chief Complaint Chief Complaint  Patient presents with  . Headache  . Back Pain  . Numbness    HPI Danielle Gross is a 26 y.o. female.  Patient complains of a headache and she had a back pain earlier today.   The history is provided by the patient. No language interpreter was used.  Headache   This is a new problem. The current episode started 12 to 24 hours ago. The problem occurs constantly. The problem has not changed since onset.The headache is associated with nothing. The pain is located in the left unilateral region. The quality of the pain is described as dull.  Back Pain   Associated symptoms include headaches. Pertinent negatives include no chest pain and no abdominal pain.    Past Medical History:  Diagnosis Date  . Chicken pox   . Headache(784.0)   . Migraines     Patient Active Problem List   Diagnosis Date Noted  . Abnormal pap - followed by Dr. Holl[facility]yGibson at [facility] 02/28/2013  . COAGULOPATHY workup in 2013, per pt done after abnormal bleeding with procedure for abnormal pap and all normal 02/13/2011  . HEMORRHAGE COMPLICATING A PROCEDURE NEC 02/13/2011  . HEADACHE 07/10/2008    History reviewed. No pertinent surgical history.  OB History    No data available       Home Medications    Prior to Admission medications   Medication Sig Start Date End Date Taking? Authorizing Provider  acetaminophen (TYLENOL) 325 MG tablet Take 650 mg by mouth every 6 (six) hours as needed for mild pain.   Yes [provider]  Multiple Vitamin (MULTIVITAMIN) tablet Take 1 tablet by mouth daily.   Yes [provider]  naproxen sodium (ANAPROX) 220 MG tablet Take 220 mg by mouth 2 (two) times daily as needed (pain).   Yes [provider]  omeprazole (PRILOSEC) 20 MG capsule Take 1 capsule (20 mg total) by mouth daily. Patient not  taking: Reported on 04/12/2017 05/16/15   Shirline FreesNafziger, Cory, NP  traMADol (ULTRAM) 50 MG tablet Take 1 tablet (50 mg total) by mouth every 6 (six) hours as needed. 04/12/17   Bethann BerkshireZammit, Charvis Lightner, MD    Family History Family History  Problem Relation Age of Onset  . Diabetes Mother   . Hypertension Mother   . Hyperlipidemia Mother   . Asthma Brother   . Anemia Other   . Clotting disorder Other   . Stroke Neg Hx   . Cancer Neg Hx     Social History Social History  Substance Use Topics  . Smoking status: Former Games developermoker  . Smokeless tobacco: Former NeurosurgeonUser  . Alcohol use Yes     Comment: occasional use     Allergies   Penicillins   Review of Systems Review of Systems  Constitutional: Negative for appetite change and fatigue.  HENT: Negative for congestion, ear discharge and sinus pressure.   Eyes: Negative for discharge.  Respiratory: Negative for cough.   Cardiovascular: Negative for chest pain.  Gastrointestinal: Negative for abdominal pain and diarrhea.  Genitourinary: Negative for frequency and hematuria.  Musculoskeletal: Positive for back pain.  Skin: Negative for rash.  Neurological: Positive for headaches. Negative for seizures.  Psychiatric/Behavioral: Negative for hallucinations.     Physical Exam Updated Vital Signs BP 123/83   Pulse (!) 102   Temp 99.9  F (37.7 C) (Oral)   Resp 18   Ht 5\' 2"  (1.575 m)   Wt 179 lb (81.2 kg)   LMP 03/30/2017   SpO2 100%   BMI 32.74 kg/m   Physical Exam  Constitutional: She is oriented to person, place, and time. She appears well-developed.  HENT:  Head: Normocephalic.  Eyes: Conjunctivae and EOM are normal. No scleral icterus.  Neck: Neck supple. No thyromegaly present.  Cardiovascular: Normal rate and regular rhythm.  Exam reveals no gallop and no friction rub.   No murmur heard. Pulmonary/Chest: No stridor. She has no wheezes. She has no rales. She exhibits no tenderness.  Abdominal: She exhibits no distension. There is  no tenderness. There is no rebound.  Musculoskeletal: Normal range of motion. She exhibits no edema.  Lymphadenopathy:    She has no cervical adenopathy.  Neurological: She is oriented to person, place, and time. She exhibits normal muscle tone. Coordination normal.  Skin: No rash noted. No erythema.  Psychiatric: She has a normal mood and affect. Her behavior is normal.     ED Treatments / Results  Labs (all labs ordered are listed, but only abnormal results are displayed) Labs Reviewed  URINALYSIS, ROUTINE W REFLEX MICROSCOPIC - Abnormal; Notable for the following:       Result Value   APPearance HAZY (*)    Ketones, ur 20 (*)    Protein, ur 30 (*)    Bacteria, UA RARE (*)    Squamous Epithelial / LPF 0-5 (*)    All other components within normal limits    EKG  EKG Interpretation None       Radiology No results found.  Procedures Procedures (including critical care time)  Medications Ordered in ED Medications  ketorolac (TORADOL) 30 MG/ML injection 30 mg (30 mg Intravenous Given 04/12/17 1753)  metoCLOPramide (REGLAN) injection 10 mg (10 mg Intravenous Given 04/12/17 1753)  diphenhydrAMINE (BENADRYL) injection 25 mg (25 mg Intravenous Given 04/12/17 1753)     Initial Impression / Assessment and Plan / ED Course  I have reviewed the triage vital signs and the nursing notes.  Pertinent labs & imaging results that were available during my care of the patient were reviewed by me and considered in my medical decision making (see chart for details).     Patient with headache. Patient improved with treatment. She is on a follow-up with her PCP as needed. Urine was negative by pain is resolved  Final Clinical Impressions(s) / ED Diagnoses   Final diagnoses:  Bad headache    New Prescriptions New Prescriptions   TRAMADOL (ULTRAM) 50 MG TABLET    Take 1 tablet (50 mg total) by mouth every 6 (six) hours as needed.     Bethann Berkshire, MD 04/12/17 2005

## 2017-04-12 NOTE — ED Triage Notes (Addendum)
Pt reports headache, back pain and left sided numbness pt unsure of onset, reports she woke up fine, went to church and the store with no issues. Symptoms began when she got to her mothers house where she laid across the bed as symptoms gradually increased. No other deficits noted. Pt has history of migraines but reports this pain is not like her migraines.

## 2017-04-12 NOTE — Discharge Instructions (Signed)
Follow up with your md if problems °

## 2017-04-12 NOTE — ED Notes (Signed)
Pt understood dc material. NAD noted. Script given at dc  

## 2017-12-04 ENCOUNTER — Ambulatory Visit: Payer: 59 | Admitting: Family Medicine

## 2017-12-04 DIAGNOSIS — Z0289 Encounter for other administrative examinations: Secondary | ICD-10-CM

## 2018-06-18 ENCOUNTER — Encounter (HOSPITAL_COMMUNITY): Payer: Self-pay | Admitting: Emergency Medicine

## 2018-06-18 ENCOUNTER — Emergency Department (HOSPITAL_COMMUNITY)
Admission: EM | Admit: 2018-06-18 | Discharge: 2018-06-19 | Disposition: A | Discharge status: Home / Self Care | Payer: 59 | Attending: Emergency Medicine | Admitting: Emergency Medicine

## 2018-06-18 DIAGNOSIS — N939 Abnormal uterine and vaginal bleeding, unspecified: Secondary | ICD-10-CM

## 2018-06-18 DIAGNOSIS — I10 Essential (primary) hypertension: Secondary | ICD-10-CM

## 2018-06-18 DIAGNOSIS — N938 Other specified abnormal uterine and vaginal bleeding: Secondary | ICD-10-CM | POA: Insufficient documentation

## 2018-06-18 DIAGNOSIS — Z87891 Personal history of nicotine dependence: Secondary | ICD-10-CM | POA: Insufficient documentation

## 2018-06-18 DIAGNOSIS — R531 Weakness: Secondary | ICD-10-CM | POA: Insufficient documentation

## 2018-06-18 LAB — CBC
HCT: 38.5 % (ref 36.0–46.0)
Hemoglobin: 12.2 g/dL (ref 12.0–15.0)
MCH: 28.3 pg (ref 26.0–34.0)
MCHC: 31.7 g/dL (ref 30.0–36.0)
MCV: 89.3 fL (ref 78.0–100.0)
PLATELETS: 280 10*3/uL (ref 150–400)
RBC: 4.31 MIL/uL (ref 3.87–5.11)
RDW: 13.6 % (ref 11.5–15.5)
WBC: 7.4 10*3/uL (ref 4.0–10.5)

## 2018-06-18 LAB — TYPE AND SCREEN
ABO/RH(D): O POS
Antibody Screen: NEGATIVE

## 2018-06-18 LAB — BASIC METABOLIC PANEL
Anion gap: 9 (ref 5–15)
BUN: 10 mg/dL (ref 6–20)
CO2: 28 mmol/L (ref 22–32)
Calcium: 9 mg/dL (ref 8.9–10.3)
Chloride: 102 mmol/L (ref 98–111)
Creatinine, Ser: 0.9 mg/dL (ref 0.44–1.00)
GFR calc Af Amer: >60 mL/min (ref >60)
GFR calc non Af Amer: >60 mL/min (ref >60)
Glucose, Bld: 104 mg/dL — ABNORMAL HIGH (ref 70–99)
POTASSIUM: 4 mmol/L (ref 3.5–5.1)
SODIUM: 139 mmol/L (ref 135–145)

## 2018-06-18 LAB — I-STAT BETA HCG BLOOD, ED (MC, WL, AP ONLY)

## 2018-06-18 LAB — ABO/RH: ABO/RH(D): O POS

## 2018-06-18 NOTE — ED Triage Notes (Signed)
Pt reports vaginal bleeding for 14 days, reports she's had some clots come out today. Reports headache today, resolved now and denies pain. Generalized weakness. Hypertensive in triage.

## 2018-06-19 MED ORDER — HYDROCHLOROTHIAZIDE 25 MG PO TABS: 12.5000 mg | ORAL_TABLET | Freq: Every day | ORAL | 0 refills | Status: AC

## 2018-06-19 NOTE — ED Provider Notes (Signed)
[facility] EMERGENCY DEPARTMENT Provider Note   CSN: [id number] Arrival date & time: 06/18/18  2241     History   Chief Complaint Chief Complaint  Patient presents with  . Vaginal Bleeding  . Weakness    HPI Danielle Gross is a 27 y.o. female.  Patient with a history of irregular periods presents with vaginal bleeding x 14 days, getting heavier, passing clots today. No lightheadedness, nausea or vomiting. No urinary symptoms. She states her period has never lasted this long. She has never treated her irregular menses in the past.   The history is provided by the patient and a parent.  Vaginal Bleeding  Primary symptoms include vaginal bleeding.  Primary symptoms include no dysuria. Pertinent negatives include no abdominal pain, no nausea and no vomiting.  Weakness  Pertinent negatives include no shortness of breath, no chest pain and no vomiting.    Past Medical History:  Diagnosis Date  . Chicken pox   . Headache(784.0)   . Migraines     Patient Active Problem List   Diagnosis Date Noted  . Abnormal pap - [facility]son [facility] 02/28/2013  . COAGULOPATHY workup in 2013, per pt done after abnormal bleeding with procedure for abnormal pap and all normal 02/13/2011  . HEMORRHAGE COMPLICATING A PROCEDURE NEC 02/13/2011  . HEADACHE 07/10/2008    History reviewed. No pertinent surgical history.   OB History   None      Home Medications    Prior to Admission medications   Medication Sig Start Date End Date Taking? Authorizing Provider  acetaminophen (TYLENOL) 325 MG tablet Take 650 mg by mouth every 6 (six) hours as needed for mild pain.    [provider]  Multiple Vitamin (MULTIVITAMIN) tablet Take 1 tablet by mouth daily.    [provider]  naproxen sodium (ANAPROX) 220 MG tablet Take 220 mg by mouth 2 (two) times daily as needed (pain).    [provider]  omeprazole (PRILOSEC) 20 MG  capsule Take 1 capsule (20 mg total) by mouth daily. Patient not taking: Reported on 04/12/2017 05/16/15   Shirline FreesNafziger, Cory, NP  traMADol (ULTRAM) 50 MG tablet Take 1 tablet (50 mg total) by mouth every 6 (six) hours as needed. 04/12/17   Bethann BerkshireZammit, Joseph, MD    Family History Family History  Problem Relation Age of Onset  . Diabetes Mother   . Hypertension Mother   . Hyperlipidemia Mother   . Asthma Brother   . Anemia Other   . Clotting disorder Other   . Stroke Neg Hx   . Cancer Neg Hx     Social History Social History   Tobacco Use  . Smoking status: Former Games developermoker  . Smokeless tobacco: Former Engineer, waterUser  Substance Use Topics  . Alcohol use: Yes    Comment: occasional use  . Drug use: Yes    Types: Marijuana     Allergies   Penicillins   Review of Systems Review of Systems  Constitutional: Negative for chills, fatigue and fever.  Respiratory: Negative.  Negative for shortness of breath.   Cardiovascular: Negative.  Negative for chest pain.  Gastrointestinal: Negative.  Negative for abdominal pain, nausea and vomiting.  Genitourinary: Positive for menstrual problem and vaginal bleeding. Negative for dysuria.  Musculoskeletal: Negative.   Skin: Negative.  Negative for pallor.  Neurological: Positive for weakness. Negative for syncope.     Physical Exam Updated Vital Signs BP (!) 171/129 (BP Location:  Right Arm)   Pulse 97   Temp 99 F (37.2 C) (Oral)   Resp 16   Ht 5\' 3"  (1.6 m)   Wt 84.4 kg (186 lb)   LMP 06/04/2018 (Approximate)   SpO2 97%   BMI 32.95 kg/m   Physical Exam  Constitutional: She is oriented to person, place, and time. She appears well-developed and well-nourished.  HENT:  Head: Normocephalic.  Neck: Normal range of motion. Neck supple.  Cardiovascular: Normal rate.  Pulmonary/Chest: Effort normal.  Abdominal: Soft. Bowel sounds are normal. There is no tenderness. There is no rebound and no guarding.  Genitourinary:  Genitourinary Comments:  Cervical bleeding, moderate. No visualized discharge, cervical friability. No CMT.   Musculoskeletal: Normal range of motion.  Neurological: She is alert and oriented to person, place, and time.  Skin: Skin is warm and dry. No rash noted.  Psychiatric: She has a normal mood and affect.     ED Treatments / Results  Labs (all labs ordered are listed, but only abnormal results are displayed) Labs Reviewed  BASIC METABOLIC PANEL - Abnormal; Notable for the following components:      Result Value   Glucose, Bld 104 (*)    All other components within normal limits  WET PREP, GENITAL  CBC  I-STAT BETA HCG BLOOD, ED ([facility], [facility], [facility] ONLY)  TYPE AND SCREEN  ABO/RH  GC/CHLAMYDIA PROBE AMP ([place]) NOT AT [facility]   Results for orders placed or performed during the hospital encounter of 06/18/18  CBC  Result Value Ref Range   WBC 7.4 4.0 - 10.5 K/uL   RBC 4.31 3.87 - 5.11 MIL/uL   Hemoglobin 12.2 12.0 - 15.0 g/dL   HCT 16.1 09.6 - 04.5 %   MCV 89.3 78.0 - 100.0 fL   MCH 28.3 26.0 - 34.0 pg   MCHC 31.7 30.0 - 36.0 g/dL   RDW 40.9 81.1 - 91.4 %   Platelets 280 150 - 400 K/uL  Basic metabolic panel  Result Value Ref Range   Sodium 139 135 - 145 mmol/L   Potassium 4.0 3.5 - 5.1 mmol/L   Chloride 102 98 - 111 mmol/L   CO2 28 22 - 32 mmol/L   Glucose, Bld 104 (H) 70 - 99 mg/dL   BUN 10 6 - 20 mg/dL   Creatinine, Ser 7.82 0.44 - 1.00 mg/dL   Calcium 9.0 8.9 - 95.6 mg/dL   GFR calc non Af Amer >60 >60 mL/min   GFR calc Af Amer >60 >60 mL/min   Anion gap 9 5 - 15  I-Stat beta hCG blood, ED  Result Value Ref Range   I-stat hCG, quantitative <5.0 <5 mIU/mL   Comment 3          Type and screen [facility]  Result Value Ref Range   ABO/RH(D) O POS    Antibody Screen NEG    Sample Expiration      06/21/2018 Performed at [facility], [address], [place], [place] [address]   ABO/Rh  Result Value Ref Range   ABO/RH(D)      O POS Performed at [facility], [address], [place], [place] [address]     EKG None  Radiology No results found.  Procedures Procedures (including critical care time)  Medications Ordered in ED Medications - No data to display   Initial Impression / Assessment and Plan / ED Course  I have reviewed the triage vital signs  and the nursing notes.  Pertinent labs & imaging results that were available during my care of the patient were reviewed by me and considered in my medical decision making (see chart for details).     Patient is here for evaluation of 14 days of vaginal bleeding. History of irregular periods.   She is hemodynamically stable - Hgb 12.2, no tachycardia, orthostasis. She is not pregnant. Feel she can follow up with GYN for further management.   She has been persistently hypertensive since arrival. No known history of HTN. No chest pain, SOB. No renal dysfunction. She reports strong family history of blood pressure, including a brother who died from stroke at 58. Discussed starting blood pressure medication and the importance of recheck with PCP in 1-2 weeks. Started on HCTZ 12.5 mg QD.  She is felt appropriate and stable for discharge home. Patient and family are comfortable with plan of discharge.  Final Clinical Impressions(s) / ED Diagnoses   Final diagnoses:  None   1. Vaginal bleeding 2. Hypertension  ED Discharge Orders    None       Elpidio Anis, Cordelia Poche 06/19/18 4098    Glynn Octave, MD 06/19/18 [phone]

## 2018-06-19 NOTE — Discharge Instructions (Addendum)
See a primary care doctor for management and recheck of blood pressures. Take HCTZ daily. Make an appointment with [facility] for treatment of irregular periods and vaginal bleeding. REturn here with any new symptoms or concerns.

## 2018-06-22 DIAGNOSIS — I10 Essential (primary) hypertension: Secondary | ICD-10-CM | POA: Insufficient documentation

## 2018-06-22 DIAGNOSIS — G43009 Migraine without aura, not intractable, without status migrainosus: Secondary | ICD-10-CM | POA: Insufficient documentation

## 2018-06-22 DIAGNOSIS — N939 Abnormal uterine and vaginal bleeding, unspecified: Secondary | ICD-10-CM

## 2018-06-22 HISTORY — DX: Abnormal uterine and vaginal bleeding, unspecified: N93.9

## 2018-06-22 HISTORY — DX: Essential (primary) hypertension: I10

## 2018-06-22 HISTORY — DX: Migraine without aura, not intractable, without status migrainosus: G43.009

## 2018-08-23 DIAGNOSIS — N3941 Urge incontinence: Secondary | ICD-10-CM

## 2018-08-23 HISTORY — DX: Urge incontinence: N39.41

## 2018-09-13 DIAGNOSIS — F411 Generalized anxiety disorder: Secondary | ICD-10-CM

## 2018-09-13 HISTORY — DX: Generalized anxiety disorder: F41.1

## 2018-11-18 ENCOUNTER — Emergency Department (HOSPITAL_COMMUNITY)
Admission: EM | Admit: 2018-11-18 | Discharge: 2018-11-18 | Disposition: A | Discharge status: Home / Self Care | Payer: 59 | Attending: Emergency Medicine | Admitting: Emergency Medicine

## 2018-11-18 ENCOUNTER — Other Ambulatory Visit: Payer: Self-pay

## 2018-11-18 ENCOUNTER — Encounter (HOSPITAL_COMMUNITY): Payer: Self-pay | Admitting: Emergency Medicine

## 2018-11-18 DIAGNOSIS — Z79899 Other long term (current) drug therapy: Secondary | ICD-10-CM | POA: Insufficient documentation

## 2018-11-18 DIAGNOSIS — I10 Essential (primary) hypertension: Secondary | ICD-10-CM

## 2018-11-18 DIAGNOSIS — R0789 Other chest pain: Secondary | ICD-10-CM | POA: Diagnosis present

## 2018-11-18 DIAGNOSIS — Z87891 Personal history of nicotine dependence: Secondary | ICD-10-CM | POA: Insufficient documentation

## 2018-11-18 LAB — CBC WITH DIFFERENTIAL/PLATELET
Abs Immature Granulocytes: 0.02 10*3/uL (ref 0.00–0.07)
BASOS PCT: 1 %
Basophils Absolute: 0.1 10*3/uL (ref 0.0–0.1)
EOS PCT: 1 %
Eosinophils Absolute: 0.1 10*3/uL (ref 0.0–0.5)
HCT: 36.5 % (ref 36.0–46.0)
Hemoglobin: 11.3 g/dL — ABNORMAL LOW (ref 12.0–15.0)
Immature Granulocytes: 0 %
Lymphocytes Relative: 34 %
Lymphs Abs: 3 10*3/uL (ref 0.7–4.0)
MCH: 26.5 pg (ref 26.0–34.0)
MCHC: 31 g/dL (ref 30.0–36.0)
MCV: 85.5 fL (ref 80.0–100.0)
Monocytes Absolute: 0.6 10*3/uL (ref 0.1–1.0)
Monocytes Relative: 6 %
Neutro Abs: 5.1 10*3/uL (ref 1.7–7.7)
Neutrophils Relative %: 58 %
Platelets: 304 10*3/uL (ref 150–400)
RBC: 4.27 MIL/uL (ref 3.87–5.11)
RDW: 14.3 % (ref 11.5–15.5)
WBC: 8.8 10*3/uL (ref 4.0–10.5)
nRBC: 0 % (ref 0.0–0.2)

## 2018-11-18 LAB — I-STAT CHEM 8, ED
BUN: 8 mg/dL (ref 6–20)
Calcium, Ion: 1.21 mmol/L (ref 1.15–1.40)
Chloride: 106 mmol/L (ref 98–111)
Creatinine, Ser: 1 mg/dL (ref 0.44–1.00)
Glucose, Bld: 93 mg/dL (ref 70–99)
HEMATOCRIT: 36 % (ref 36.0–46.0)
HEMOGLOBIN: 12.2 g/dL (ref 12.0–15.0)
Potassium: 3.7 mmol/L (ref 3.5–5.1)
SODIUM: 138 mmol/L (ref 135–145)
TCO2: 25 mmol/L (ref 22–32)

## 2018-11-18 LAB — I-STAT BETA HCG BLOOD, ED (MC, WL, AP ONLY)

## 2018-11-18 LAB — I-STAT TROPONIN, ED: TROPONIN I, POC: 0 ng/mL (ref 0.00–0.08)

## 2018-11-18 MED ORDER — METOCLOPRAMIDE HCL 5 MG/ML IJ SOLN
10.0000 mg | Freq: Once | INTRAMUSCULAR | Status: AC
  Administered 2018-11-18: 10 mg via INTRAVENOUS
  Filled 2018-11-18: qty 2

## 2018-11-18 MED ORDER — DIPHENHYDRAMINE HCL 50 MG/ML IJ SOLN
12.5000 mg | Freq: Once | INTRAMUSCULAR | Status: AC
  Administered 2018-11-18: 12.5 mg via INTRAVENOUS
  Filled 2018-11-18: qty 1

## 2018-11-18 MED ORDER — KETOROLAC TROMETHAMINE 15 MG/ML IJ SOLN
15.0000 mg | Freq: Once | INTRAMUSCULAR | Status: AC
  Administered 2018-11-18: 15 mg via INTRAVENOUS
  Filled 2018-11-18: qty 1

## 2018-11-18 MED ORDER — MORPHINE SULFATE (PF) 4 MG/ML IV SOLN
4.0000 mg | Freq: Once | INTRAVENOUS | Status: AC
Start: 2018-11-18 — End: 2018-11-18
  Administered 2018-11-18: 4 mg via INTRAVENOUS
  Filled 2018-11-18: qty 1

## 2018-11-18 NOTE — ED Triage Notes (Signed)
Pt BIB [facility] for htn. [facility] was [facility]. 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 Pt was given medications and chest tightness resolved. She states feels tight, a little SHOB, and a headache

## 2018-11-18 NOTE — Discharge Instructions (Addendum)
Follow up with your doctor as planned for further evaluation and management of blood pressure. Return here as needed for new or concerning symptoms.

## 2018-11-18 NOTE — ED Notes (Signed)
Patient verbalizes understanding of discharge instructions. Opportunity for questioning and answers were provided. Armband removed by staff, pt discharged from ED. Pt wheeled out into lobby with family

## 2018-11-18 NOTE — ED Provider Notes (Signed)
[facility] Provider Note   CSN: [id number] Arrival date & time: 11/18/18  0229     History   Chief Complaint Chief Complaint  Patient presents with  . Chest Injury  . Hypertension    HPI Danielle Gross is a 27 y.o. female.  Patient to ED with complaint of chest tightness, mild SOB and concern for elevated blood pressure. She was seen at [facility] yesterday morning for similar symptoms and discharged home. She felt better initially and then started having chest tightness again, checked her blood pressure multiple times and found it increasing. She reports a parietal headache that feels like pressure. No vomiting, recent fever or illness. She states she has been treated for hypertension since July of this year with Lisinopril, starting at 10 mg and currently on 30 mg. She states she is compliant with this.   The history is provided by the patient and the EMS personnel. No language interpreter was used.    Past Medical History:  Diagnosis Date  . Chicken pox   . Headache(784.0)   . Migraines     Patient Active Problem List   Diagnosis Date Noted  . Abnormal pap - followed by Dr. Hollice Esp[facility]Gibson [facility] 02/28/2013  . COAGULOPATHY workup in 2013, per pt done after abnormal bleeding with procedure for abnormal pap and all normal 02/13/2011  . HEMORRHAGE COMPLICATING A PROCEDURE NEC 02/13/2011  . HEADACHE 07/10/2008    History reviewed. No pertinent surgical history.   OB History   No obstetric history on file.      Home Medications    Prior to Admission medications   Medication Sig Start Date End Date Taking? Authorizing Provider  acetaminophen (TYLENOL) 325 MG tablet Take 650 mg by mouth every 6 (six) hours as needed for mild pain.    [provider]  hydrochlorothiazide (HYDRODIURIL) 25 MG tablet Take 0.5 tablets (12.5 mg total) by mouth daily. 06/19/18   Elpidio AnisUpstill, Dartha Rozzell, PA-C  Multiple Vitamin (MULTIVITAMIN)  tablet Take 1 tablet by mouth daily.    [provider]  naproxen sodium (ANAPROX) 220 MG tablet Take 220 mg by mouth 2 (two) times daily as needed (pain).    [provider]  omeprazole (PRILOSEC) 20 MG capsule Take 1 capsule (20 mg total) by mouth daily. Patient not taking: Reported on 04/12/2017 05/16/15   Shirline FreesNafziger, Cory, NP  traMADol (ULTRAM) 50 MG tablet Take 1 tablet (50 mg total) by mouth every 6 (six) hours as needed. 04/12/17   Bethann BerkshireZammit, Joseph, MD    Family History Family History  Problem Relation Age of Onset  . Diabetes Mother   . Hypertension Mother   . Hyperlipidemia Mother   . Asthma Brother   . Anemia Other   . Clotting disorder Other   . Stroke Neg Hx   . Cancer Neg Hx     Social History Social History   Tobacco Use  . Smoking status: Former Games developermoker  . Smokeless tobacco: Former Engineer, waterUser  Substance Use Topics  . Alcohol use: Yes    Comment: occasional use  . Drug use: Yes    Types: Marijuana     Allergies   Penicillins   Review of Systems Review of Systems  Constitutional: Negative for chills and fever.  HENT: Negative.   Respiratory: Positive for chest tightness and shortness of breath.   Cardiovascular: Negative.  Negative for chest pain.  Gastrointestinal: Negative.  Negative for abdominal pain and vomiting.  Musculoskeletal: Negative.  Negative for myalgias.  Skin: Negative.   Neurological: Negative.  Negative for syncope, weakness and light-headedness.     Physical Exam Updated Vital Signs BP (!) 176/119   Pulse 86   Resp 20   Ht 5\' 2"  (1.575 m)   Wt 90.3 kg   SpO2 99%   BMI 36.40 kg/m   Physical Exam Constitutional:      Appearance: She is well-developed.  HENT:     Head: Normocephalic.  Neck:     Musculoskeletal: Normal range of motion and neck supple.  Cardiovascular:     Rate and Rhythm: Normal rate and regular rhythm.     Heart sounds: No murmur.  Pulmonary:     Effort: Pulmonary effort is normal.     Breath  sounds: Normal breath sounds. No wheezing, rhonchi or rales.  Abdominal:     General: Bowel sounds are normal.     Palpations: Abdomen is soft.     Tenderness: There is no abdominal tenderness. There is no guarding or rebound.  Musculoskeletal: Normal range of motion.     Right lower leg: No edema.     Left lower leg: No edema.  Skin:    General: Skin is warm and dry.     Findings: No rash.  Neurological:     General: No focal deficit present.     Mental Status: She is alert and oriented to person, place, and time.      ED Treatments / Results  Labs (all labs ordered are listed, but only abnormal results are displayed) Labs Reviewed  CBC WITH DIFFERENTIAL/PLATELET  I-STAT BETA HCG BLOOD, ED ([facility], [facility], [facility] ONLY)  I-STAT CHEM 8, ED  I-STAT TROPONIN, ED    EKG None  Radiology No results found.  Procedures Procedures (including critical care time)  Medications Ordered in ED Medications - No data to display   Initial Impression / Assessment and Plan / ED Course  I have reviewed the triage vital signs and the nursing notes.  Pertinent labs & imaging results that were available during my care of the patient were reviewed by me and considered in my medical decision making (see chart for details).     Patient to ED with chest tightness, mild SOB, headache. Se[facility] for [facility] at [facility]. She called EMS because her blood pressure kept increasing and her chest tightness returned.   She states she was told there was an abnormal finding on her adrenal gland that is worrying her. Strongly encouraged outpatient follow up of this with PCP.   Chair reviewed. Work up at [facility] included labs that were normal, including troponin, CTA that was negative for large vessel abnormality and PE. She was reported to be asymptomatic at discharge.   Blood pressure here is elevated, improved with medication and time. Repeat labs are unremarkable. She continues to complain of headache. No  neurologic deficits. She reports history of headache. IV headache cocktail provided prior to discharge for comfort.   She is felt appropriate for discharge home. Parents at bedside. Strongly encouraged PCP follow up of adrenal abnormality and recheck of blood pressure. All questions answered.   Final Clinical Impressions(s) / ED Diagnoses   Final diagnoses:  None   1. Hypertension   ED Discharge Orders    None       Elpidio AnisUpstill, Shaterica Mcclatchy, PA-C 11/18/18 [id number]    Ward, Layla MawKristen N, DO 11/18/18 0710

## 2019-11-14 ENCOUNTER — Other Ambulatory Visit: Payer: Self-pay

## 2019-11-14 ENCOUNTER — Ambulatory Visit (HOSPITAL_COMMUNITY)
Admission: EM | Admit: 2019-11-14 | Discharge: 2019-11-14 | Disposition: A | Discharge status: Home / Self Care | Payer: 59 | Attending: Internal Medicine | Admitting: Internal Medicine

## 2019-11-14 ENCOUNTER — Encounter (HOSPITAL_COMMUNITY): Payer: Self-pay

## 2019-11-14 DIAGNOSIS — G43109 Migraine with aura, not intractable, without status migrainosus: Secondary | ICD-10-CM

## 2019-11-14 MED ORDER — METOCLOPRAMIDE HCL 5 MG/ML IJ SOLN
INTRAMUSCULAR | Status: AC
  Filled 2019-11-14: qty 2

## 2019-11-14 MED ORDER — SUMATRIPTAN SUCCINATE 6 MG/0.5ML ~~LOC~~ SOLN
SUBCUTANEOUS | Status: AC
  Filled 2019-11-14: qty 0.5

## 2019-11-14 MED ORDER — METOCLOPRAMIDE HCL 5 MG/ML IJ SOLN
10.0000 mg | Freq: Once | INTRAMUSCULAR | Status: AC
  Administered 2019-11-14: 21:00:00 10 mg via INTRAMUSCULAR

## 2019-11-14 MED ORDER — DEXAMETHASONE SODIUM PHOSPHATE 10 MG/ML IJ SOLN
10.0000 mg | Freq: Once | INTRAMUSCULAR | Status: AC
  Administered 2019-11-14: 21:00:00 10 mg via INTRAMUSCULAR

## 2019-11-14 MED ORDER — SUMATRIPTAN SUCCINATE 25 MG PO TABS
25.0000 mg | ORAL_TABLET | Freq: Once | ORAL | 0 refills | Status: AC
Start: 2019-11-14 — End: 2019-11-14

## 2019-11-14 MED ORDER — SUMATRIPTAN SUCCINATE 6 MG/0.5ML ~~LOC~~ SOLN
6.0000 mg | Freq: Once | SUBCUTANEOUS | Status: AC
  Administered 2019-11-14: 6 mg via SUBCUTANEOUS

## 2019-11-14 MED ORDER — DEXAMETHASONE SODIUM PHOSPHATE 10 MG/ML IJ SOLN
INTRAMUSCULAR | Status: AC
  Filled 2019-11-14: qty 1

## 2019-11-14 NOTE — Discharge Instructions (Signed)
If headache worsens, please go to the ED for re-evaluation.

## 2019-11-14 NOTE — ED Provider Notes (Signed)
[place]    CSN: [id number] Arrival date & time: 11/14/19  8182      History   Chief Complaint Chief Complaint  Patient presents with  . Migraine    HPI Danielle Gross is a 28 y.o. female with a history of migraines comes to urgent care with complaints of severe headache over the past couple of days.  Patient says the headache is unilateral and settles in the left retro-orbital area.  Currently headache is 10 out of 10.  Aggravated by bright light.  No relieving factors.  Patient had an aura prior to coming to the urgent care.  No nausea or vomiting.  No radiation of pain.  Patient denies any numbness or tingling.  No lateralizing weakness.  No fall.   HPI  Past Medical History:  Diagnosis Date  . Chicken pox   . Headache(784.0)   . Migraines     Patient Active Problem List   Diagnosis Date Noted  . Abnormal pap - followed by Dr. Noberto Retort at [facility] 02/28/2013  . COAGULOPATHY workup in 2013, per pt done after abnormal bleeding with procedure for abnormal pap and all normal 02/13/2011  . HEMORRHAGE COMPLICATING A PROCEDURE NEC 02/13/2011  . HEADACHE 07/10/2008    History reviewed. No pertinent surgical history.  OB History   No obstetric history on file.      Home Medications    Prior to Admission medications   Medication Sig Start Date End Date Taking? Authorizing Provider  acetaminophen (TYLENOL) 325 MG tablet Take 650 mg by mouth every 6 (six) hours as needed for mild pain.    [provider]  hydrochlorothiazide (HYDRODIURIL) 25 MG tablet Take 0.5 tablets (12.5 mg total) by mouth daily. 06/19/18   Charlann Lange, PA-C  Multiple Vitamin (MULTIVITAMIN) tablet Take 1 tablet by mouth daily.    [provider]  naproxen sodium (ANAPROX) 220 MG tablet Take 220 mg by mouth 2 (two) times daily as needed (pain).    [provider]  SUMAtriptan (IMITREX) 25 MG tablet Take 1 tablet (25 mg total) by mouth once for 1 dose.  Please take 1 tablet at the onset of your headache. May repeat in 2 hours if headache persists or recurs. No more than 4 tablets a day 11/14/19 11/14/19  Chase Picket, MD  traMADol (ULTRAM) 50 MG tablet Take 1 tablet (50 mg total) by mouth every 6 (six) hours as needed. 04/12/17   Milton Ferguson, MD  omeprazole (PRILOSEC) 20 MG capsule Take 1 capsule (20 mg total) by mouth daily. Patient not taking: Reported on 04/12/2017 05/16/15 11/14/19  Dorothyann Peng, NP    Family History Family History  Problem Relation Age of Onset  . Diabetes Mother   . Hypertension Mother   . Hyperlipidemia Mother   . Asthma Brother   . Anemia Other   . Clotting disorder Other   . Stroke Neg Hx   . Cancer Neg Hx     Social History Social History   Tobacco Use  . Smoking status: Former Research scientist (life sciences)  . Smokeless tobacco: Former Network engineer Use Topics  . Alcohol use: Yes    Comment: occasional use  . Drug use: Yes    Types: Marijuana     Allergies   Penicillins   Review of Systems Review of Systems  Constitutional: Positive for activity change. Negative for chills, fatigue and fever.  HENT: Negative.   Eyes: Positive for photophobia. Negative for pain, itching and  visual disturbance.  Respiratory: Negative for cough, chest tightness and shortness of breath.   Gastrointestinal: Negative for diarrhea, nausea and vomiting.  Genitourinary: Negative.   Musculoskeletal: Negative for arthralgias, back pain and myalgias.  Skin: Negative.   Neurological: Positive for headaches. Negative for dizziness, facial asymmetry and light-headedness.  Psychiatric/Behavioral: Negative for confusion and decreased concentration.     Physical Exam Triage Vital Signs ED Triage Vitals  Enc Vitals Group     BP      Pulse      Resp      Temp      Temp src      SpO2      Weight      Height      Head Circumference      Peak Flow      Pain Score      Pain Loc      Pain Edu?      Excl. in GC?    No data  found.  Updated Vital Signs BP (!) 147/107 (BP Location: Left Arm)   Pulse 85   Temp 98.1 F (36.7 C) (Oral)   Resp 16   SpO2 99%   Visual Acuity Right Eye Distance:   Left Eye Distance:   Bilateral Distance:    Right Eye Near:   Left Eye Near:    Bilateral Near:     Physical Exam Constitutional:      General: She is in acute distress.     Appearance: She is ill-appearing.  HENT:     Right Ear: Tympanic membrane normal.     Left Ear: Tympanic membrane normal.     Nose: No rhinorrhea.     Mouth/Throat:     Mouth: Mucous membranes are moist.     Pharynx: No posterior oropharyngeal erythema.  Cardiovascular:     Rate and Rhythm: Normal rate and regular rhythm.     Pulses: Normal pulses.     Heart sounds: Normal heart sounds.  Pulmonary:     Effort: Pulmonary effort is normal. No respiratory distress.     Breath sounds: No rhonchi or rales.  Abdominal:     General: Bowel sounds are normal. There is no distension.     Palpations: Abdomen is soft.     Tenderness: There is no guarding or rebound.  Musculoskeletal:        General: No swelling or tenderness. Normal range of motion.  Skin:    General: Skin is warm.     Capillary Refill: Capillary refill takes less than 2 seconds.     Coloration: Skin is not jaundiced.     Findings: No erythema.  Neurological:     General: No focal deficit present.     Mental Status: She is alert and oriented to person, place, and time.     Cranial Nerves: No cranial nerve deficit.     Sensory: No sensory deficit.     Motor: No weakness.     Coordination: Coordination normal.     Gait: Gait normal.     Deep Tendon Reflexes: Reflexes normal.  Psychiatric:        Mood and Affect: Mood normal.        Behavior: Behavior normal.      UC Treatments / Results  Labs (all labs ordered are listed, but only abnormal results are displayed) Labs Reviewed - No data to display  EKG   Radiology No results found.  Procedures Procedures  (including critical care time)  Medications Ordered in UC Medications  dexamethasone (DECADRON) injection 10 mg (10 mg Intramuscular Given 11/14/19 2048)  SUMAtriptan (IMITREX) injection 6 mg (6 mg Subcutaneous Given 11/14/19 2052)  metoCLOPramide (REGLAN) injection 10 mg (10 mg Intramuscular Given 11/14/19 2047)    Initial Impression / Assessment and Plan / UC Course  I have reviewed the triage vital signs and the nursing notes.  Pertinent labs & imaging results that were available during my care of the patient were reviewed by me and considered in my medical decision making (see chart for details).     1.  Migraine with aura and without status migrainosus: Decadron 10 mg IM x1 dose Reglan 10 mg IM x1 dose Imitrex 6 mg subcu x1 dose Imitrex 25 mg as needed for migraine headaches. If patient's symptoms worsen she is advised to go to emergency department to be evaluated.  If patient has recurrent migraine episodes with frequency more than twice a week, she may benefit from chronic therapy for recurrent migraines or neurologic evaluation.. Final Clinical Impressions(s) / UC Diagnoses   Final diagnoses:  Migraine with aura and without status migrainosus, not intractable     Discharge Instructions     If headache worsens, please go to the ED for re-evaluation.    ED Prescriptions    Medication Sig Dispense Auth. Provider   SUMAtriptan (IMITREX) 25 MG tablet Take 1 tablet (25 mg total) by mouth once for 1 dose. Please take 1 tablet at the onset of your headache. May repeat in 2 hours if headache persists or recurs. No more than 4 tablets a day 20 tablet Chanti Golubski, Britta MccreedyPhilip O, MD     PDMP not reviewed this encounter.   Merrilee JanskyLamptey, Charma Mocarski O, MD 11/15/19 [phone]

## 2019-11-14 NOTE — ED Triage Notes (Signed)
Pt present a headache for two days with sensitive to light.

## 2020-02-14 DIAGNOSIS — Z720 Tobacco use: Secondary | ICD-10-CM | POA: Insufficient documentation

## 2020-02-14 DIAGNOSIS — G8929 Other chronic pain: Secondary | ICD-10-CM

## 2020-02-14 HISTORY — DX: Other chronic pain: G89.29

## 2020-02-14 HISTORY — DX: Tobacco use: Z72.0

## 2023-01-25 ENCOUNTER — Encounter (HOSPITAL_COMMUNITY): Payer: Self-pay | Admitting: Emergency Medicine

## 2023-01-25 ENCOUNTER — Emergency Department (HOSPITAL_COMMUNITY)
Admission: EM | Admit: 2023-01-25 | Discharge: 2023-01-25 | Disposition: A | Discharge status: Home / Self Care | Payer: Medicaid Other | Attending: Emergency Medicine | Admitting: Emergency Medicine

## 2023-01-25 DIAGNOSIS — K0889 Other specified disorders of teeth and supporting structures: Secondary | ICD-10-CM | POA: Diagnosis present

## 2023-01-25 DIAGNOSIS — K029 Dental caries, unspecified: Secondary | ICD-10-CM | POA: Insufficient documentation

## 2023-01-25 MED ORDER — CLINDAMYCIN HCL 150 MG PO CAPS: 150.0000 mg | ORAL_CAPSULE | Freq: Four times a day (QID) | ORAL | 0 refills | Status: AC

## 2023-01-25 MED ORDER — IBUPROFEN 800 MG PO TABS
800.0000 mg | ORAL_TABLET | Freq: Once | ORAL | Status: AC
  Administered 2023-01-25: 800 mg via ORAL
  Filled 2023-01-25: qty 1

## 2023-01-25 MED ORDER — CLINDAMYCIN HCL 150 MG PO CAPS
150.0000 mg | ORAL_CAPSULE | Freq: Once | ORAL | Status: AC
  Administered 2023-01-25: 150 mg via ORAL
  Filled 2023-01-25: qty 1

## 2023-01-25 MED ORDER — KETOROLAC TROMETHAMINE 60 MG/2ML IM SOLN
60.0000 mg | Freq: Once | INTRAMUSCULAR | Status: AC
  Administered 2023-01-25: 60 mg via INTRAMUSCULAR
  Filled 2023-01-25: qty 2

## 2023-01-25 NOTE — ED Provider Notes (Signed)
[place] Provider Note   CSN: [id number] Arrival date & time: 01/25/23  2028     History  No chief complaint on file.   Danielle Gross is a 32 y.o. female.  Patient is a 32 year old female presenting today with complaints of dental pain.  She reports her tooth has been hurting for approximately a month and was seen over a week ago and given doxycycline for concern for a dental carry.  She reports it did not really change her pain much.  She is planning on seeing a dentist tomorrow for possible extraction but reports the pain just became severe tonight.  She has no facial swelling, swelling in her throat or difficulty swallowing.  The history is provided by the patient.       Home Medications Prior to Admission medications   Medication Sig Start Date End Date Taking? Authorizing Provider  clindamycin (CLEOCIN) 150 MG capsule Take 1 capsule (150 mg total) by mouth every 6 (six) hours. 01/25/23  Yes Blanchie Dessert, MD  acetaminophen (TYLENOL) 325 MG tablet Take 650 mg by mouth every 6 (six) hours as needed for mild pain.    [provider]  hydrochlorothiazide (HYDRODIURIL) 25 MG tablet Take 0.5 tablets (12.5 mg total) by mouth daily. 06/19/18   Charlann Lange, PA-C  Multiple Vitamin (MULTIVITAMIN) tablet Take 1 tablet by mouth daily.    [provider]  naproxen sodium (ANAPROX) 220 MG tablet Take 220 mg by mouth 2 (two) times daily as needed (pain).    [provider]  SUMAtriptan (IMITREX) 25 MG tablet Take 1 tablet (25 mg total) by mouth once for 1 dose. Please take 1 tablet at the onset of your headache. May repeat in 2 hours if headache persists or recurs. No more than 4 tablets a day 11/14/19 11/14/19  Chase Picket, MD  traMADol (ULTRAM) 50 MG tablet Take 1 tablet (50 mg total) by mouth every 6 (six) hours as needed. 04/12/17   Milton Ferguson, MD  omeprazole (PRILOSEC) 20 MG capsule Take 1 capsule (20  mg total) by mouth daily. Patient not taking: Reported on 04/12/2017 05/16/15 11/14/19  Dorothyann Peng, NP      Allergies    Penicillins    Review of Systems   Review of Systems  Physical Exam Updated Vital Signs BP (!) 133/101 (BP Location: Right Arm)   Pulse 99   Temp 98.4 F (36.9 C) (Oral)   Resp 17   SpO2 99%  Physical Exam Vitals and nursing note reviewed.  HENT:     Head: Normocephalic.     Mouth/Throat:   Pulmonary:     Effort: Pulmonary effort is normal.  Skin:    General: Skin is warm and dry.  Neurological:     Mental Status: She is alert and oriented to person, place, and time. Mental status is at baseline.  Psychiatric:        Mood and Affect: Mood normal.        Behavior: Behavior normal.     ED Results / Procedures / Treatments   Labs (all labs ordered are listed, but only abnormal results are displayed) Labs Reviewed - No data to display  EKG None  Radiology No results found.  Procedures Procedures    Medications Ordered in ED Medications  ketorolac (TORADOL) injection 60 mg (has no administration in time range)  clindamycin (CLEOCIN) capsule 150 mg (has no administration in time range)  ibuprofen (ADVIL) tablet  800 mg (800 mg Oral Given 01/25/23 2050)    ED Course/ Medical Decision Making/ A&P                             Medical Decision Making Risk Prescription drug management.   Pt with dental caries and facial swelling.  No signs of ludwig's angina or difficulty swallowing and no systemic symptoms. Will treat with clinda due to pcn allergy and have pt f/u with dentist tomorrow as planned.         Final Clinical Impression(s) / ED Diagnoses Final diagnoses:  Dental caries    Rx / DC Orders ED Discharge Orders          Ordered    clindamycin (CLEOCIN) 150 MG capsule  Every 6 hours        01/25/23 2133              Blanchie Dessert, MD 01/25/23 2139

## 2023-01-25 NOTE — ED Triage Notes (Signed)
Pt here from home with c/o recurring dental pain , finished antibiotics a few weeks ago but never made it to the dentist

## 2023-10-19 ENCOUNTER — Emergency Department (HOSPITAL_BASED_OUTPATIENT_CLINIC_OR_DEPARTMENT_OTHER)
Admission: EM | Admit: 2023-10-19 | Discharge: 2023-10-20 | Disposition: A | Discharge status: Home / Self Care | Payer: Medicaid Other

## 2023-10-19 ENCOUNTER — Other Ambulatory Visit: Payer: Self-pay

## 2023-10-19 DIAGNOSIS — N939 Abnormal uterine and vaginal bleeding, unspecified: Secondary | ICD-10-CM | POA: Insufficient documentation

## 2023-10-19 DIAGNOSIS — R42 Dizziness and giddiness: Secondary | ICD-10-CM | POA: Diagnosis not present

## 2023-10-19 LAB — CBC
HCT: 38.9 % (ref 36.0–46.0)
Hemoglobin: 12.9 g/dL (ref 12.0–15.0)
MCH: 29.1 pg (ref 26.0–34.0)
MCHC: 33.2 g/dL (ref 30.0–36.0)
MCV: 87.8 fL (ref 80.0–100.0)
Platelets: 300 10*3/uL (ref 150–400)
RBC: 4.43 MIL/uL (ref 3.87–5.11)
RDW: 14 % (ref 11.5–15.5)
WBC: 8.9 10*3/uL (ref 4.0–10.5)
nRBC: 0 % (ref 0.0–0.2)

## 2023-10-19 LAB — PREGNANCY, URINE: Preg Test, Ur: NEGATIVE

## 2023-10-19 NOTE — ED Provider Notes (Signed)
[place] EMERGENCY DEPARTMENT AT [facility] Provider Note   CSN: [id number] Arrival date & time: 10/19/23  1749     History  Chief Complaint  Patient presents with   Vaginal Bleeding    SHIRLEY OLEKSIAK is a 32 y.o. female.  32 year old female presenting emergency department for vaginal bleeding.  She states that she is had vaginal bleeding for the past 4 to 5 weeks.  Started with spotting for 4 to 5 days and gradually worsened and has been consistent with typical.  For the past week or so.  Today she developed some lightheadedness and shakiness which is what prompted her visit here in the emergency department.  Symptoms improved   Vaginal Bleeding      Home Medications Prior to Admission medications   Medication Sig Start Date End Date Taking? Authorizing Provider  acetaminophen (TYLENOL) 325 MG tablet Take 650 mg by mouth every 6 (six) hours as needed for mild pain.    [provider]  clindamycin (CLEOCIN) 150 MG capsule Take 1 capsule (150 mg total) by mouth every 6 (six) hours. 01/25/23   Gwyneth Sprout, MD  hydrochlorothiazide (HYDRODIURIL) 25 MG tablet Take 0.5 tablets (12.5 mg total) by mouth daily. 06/19/18   Elpidio Anis, PA-C  Multiple Vitamin (MULTIVITAMIN) tablet Take 1 tablet by mouth daily.    [provider]  naproxen sodium (ANAPROX) 220 MG tablet Take 220 mg by mouth 2 (two) times daily as needed (pain).    [provider]  SUMAtriptan (IMITREX) 25 MG tablet Take 1 tablet (25 mg total) by mouth once for 1 dose. Please take 1 tablet at the onset of your headache. May repeat in 2 hours if headache persists or recurs. No more than 4 tablets a day 11/14/19 11/14/19  Merrilee Jansky, MD  traMADol (ULTRAM) 50 MG tablet Take 1 tablet (50 mg total) by mouth every 6 (six) hours as needed. 04/12/17   Bethann Berkshire, MD  omeprazole (PRILOSEC) 20 MG capsule Take 1 capsule (20 mg total) by mouth daily. Patient not taking: Reported on  04/12/2017 05/16/15 11/14/19  Shirline Frees, NP      Allergies    Penicillins    Review of Systems   Review of Systems  Genitourinary:  Positive for vaginal bleeding.    Physical Exam Updated Vital Signs BP (!) 128/96   Pulse 73   Temp 98.7 F (37.1 C) (Oral)   Resp 18   Ht 5\' 2"  (1.575 m)   Wt 81.6 kg   SpO2 100%   BMI 32.92 kg/m  Physical Exam Vitals and nursing note reviewed.  Constitutional:      General: She is not in acute distress.    Appearance: She is not toxic-appearing.  HENT:     Head: Normocephalic.     Mouth/Throat:     Mouth: Mucous membranes are moist.  Eyes:     Conjunctiva/sclera: Conjunctivae normal.  Cardiovascular:     Rate and Rhythm: Normal rate and regular rhythm.  Pulmonary:     Effort: Pulmonary effort is normal.  Abdominal:     General: Abdomen is flat. There is no distension.     Tenderness: There is no abdominal tenderness. There is no guarding or rebound.  Musculoskeletal:        General: Normal range of motion.  Skin:    General: Skin is warm and dry.     Capillary Refill: Capillary refill takes less than 2 seconds.  Neurological:  Mental Status: She is alert and oriented to person, place, and time.  Psychiatric:        Mood and Affect: Mood normal.        Behavior: Behavior normal.     ED Results / Procedures / Treatments   Labs (all labs ordered are listed, but only abnormal results are displayed) Labs Reviewed  PREGNANCY, URINE  CBC    EKG None  Radiology No results found.  Procedures Procedures    Medications Ordered in ED Medications - No data to display  ED Course/ Medical Decision Making/ A&P                                 Medical Decision Making This is a 32 year old female presenting emergency department for vaginal bleeding.  She is afebrile nontachycardic hemodynamically stable.  Appears to be normal sinus rhythm at a rate in the 70s while I was in the room.  Has a soft benign abdomen.   Hemoglobin level at 12.9.  She is not pregnant based on U pregnant.  Patient has been in the emergency department roughly 5 hours prior to evaluation.  Ambulated to the bathroom with steady gait.  Stable for discharge.  Follow-up with PCP.  Amount and/or Complexity of Data Reviewed Labs: ordered.          Final Clinical Impression(s) / ED Diagnoses Final diagnoses:  None    Rx / DC Orders ED Discharge Orders     None         Coral Spikes, DO 10/19/23 2322

## 2023-10-19 NOTE — ED Notes (Signed)
Pt ambulates to bathroom with steady gait

## 2023-10-19 NOTE — ED Triage Notes (Signed)
States vaginal bleeding x 5 weeks. States she is going through 3 to 4 pads an hour. Reports some dizziness and abd tenderness.

## 2023-10-19 NOTE — Discharge Instructions (Signed)
Please follow-up with your OB/GYN.  Return immediately develop fevers, chills, severe abdominal pain, passout, shortness of breath, worsening vaginal bleeding or you develop any new or worsening symptoms that are concerning to you.

## 2023-10-20 NOTE — ED Notes (Signed)
Pt left prior to discharge instructions and discharge vital signs.

## 2023-11-24 ENCOUNTER — Other Ambulatory Visit: Payer: Self-pay

## 2023-11-30 NOTE — Progress Notes (Deleted)
 Ellouise Console, PA-C [address]  [address]  [place], [place] [address]  Main: [phone]  Fax: [phone]   Gastroenterology Consultation  Referring Provider:     Omar Cumins, FNP Primary Care Physician:  Omar Cumins, FNP Primary Gastroenterologist:  *** Reason for Consultation:     ***        HPI:   LOVETA DELLIS is a 32 y.o. y/o female referred for consultation & management  by Omar Cumins, FNP.    GI symptoms  10/19/2023 labs: Normal CBC.  Hemoglobin 12.9.  Negative urine pregnancy test.  No previous GI evaluation or procedures.  06/2023 abdominal x-ray (to evaluate constipation) showed mild stool burden.  No acute abnormality.  No other abdominal imaging.  Past Medical History:  Diagnosis Date   Abnormal uterine and vaginal bleeding, unspecified 06/22/2018   Chicken pox    Chronic right-sided low back pain with right-sided sciatica 02/14/2020   Essential hypertension 06/22/2018   Generalized anxiety disorder 09/13/2018   Headache(784.0)    Migraine without aura and without status migrainosus, not intractable 06/22/2018   Migraines    Tobacco abuse 02/14/2020   Urge incontinence of urine 08/23/2018    No past surgical history on file.  Prior to Admission medications   Medication Sig Start Date End Date Taking? Authorizing Provider  acetaminophen  (TYLENOL ) 325 MG tablet Take 650 mg by mouth every 6 (six) hours as needed for mild pain.    [provider]  cetirizine (ZYRTEC) 10 MG tablet Take by mouth. 10/07/21   [provider]  clindamycin  (CLEOCIN ) 150 MG capsule Take 1 capsule (150 mg total) by mouth every 6 (six) hours. 01/25/23   Doretha Folks, MD  hydrochlorothiazide  (HYDRODIURIL ) 25 MG tablet Take 0.5 tablets (12.5 mg total) by mouth daily. 06/19/18   Odell Balls, PA-C  Multiple Vitamin (MULTIVITAMIN) tablet Take 1 tablet by mouth daily.    [provider]  naproxen sodium (ANAPROX) 220 MG tablet Take 220 mg  by mouth 2 (two) times daily as needed (pain).    [provider]  SUMAtriptan  (IMITREX ) 25 MG tablet Take 1 tablet (25 mg total) by mouth once for 1 dose. Please take 1 tablet at the onset of your headache. May repeat in 2 hours if headache persists or recurs. No more than 4 tablets a day 11/14/19 11/14/19  Blaise Aleene KIDD, MD  traMADol  (ULTRAM ) 50 MG tablet Take 1 tablet (50 mg total) by mouth every 6 (six) hours as needed. 04/12/17   Suzette Pac, MD  omeprazole  (PRILOSEC) 20 MG capsule Take 1 capsule (20 mg total) by mouth daily. Patient not taking: Reported on 04/12/2017 05/16/15 11/14/19  Merna Huxley, NP    Family History  Problem Relation Age of Onset   Diabetes Mother    Hypertension Mother    Hyperlipidemia Mother    Asthma Brother    Anemia Other    Clotting disorder Other    Stroke Neg Hx    Cancer Neg Hx      Social History   Tobacco Use   Smoking status: Former   Smokeless tobacco: Former  Substance Use Topics   Alcohol use: Yes    Comment: occasional use   Drug use: Yes    Types: Marijuana    Allergies as of 12/01/2023 - Review Complete 10/19/2023  Allergen Reaction Noted   Penicillins Hives 06/05/2014    Review of Systems:    All systems reviewed and negative except where noted in  HPI.   Physical Exam:  There were no vitals taken for this visit. No LMP recorded.  General:   Alert,  Well-developed, well-nourished, pleasant and cooperative in NAD Lungs:  Respirations even and unlabored.  Clear throughout to auscultation.   No wheezes, crackles, or rhonchi. No acute distress. Heart:  Regular rate and rhythm; no murmurs, clicks, rubs, or gallops. Abdomen:  Normal bowel sounds.  No bruits.  Soft, and non-distended without masses, hepatosplenomegaly or hernias noted.  No Tenderness.  No guarding or rebound tenderness.    Neurologic:  Alert and oriented x3;  grossly normal neurologically. Psych:  Alert and cooperative. Normal mood and  affect.  Imaging Studies: No results found.  Assessment and Plan:   MIKAELA HILGEMAN is a 32 y.o. y/o female has been referred for ***  Follow up ***  Ellouise Console, PA-C    BP check ***

## 2023-12-01 ENCOUNTER — Ambulatory Visit: Payer: Medicaid Other | Admitting: Physician Assistant

## 2023-12-09 ENCOUNTER — Encounter: Payer: Medicaid Other | Admitting: Obstetrics and Gynecology

## 2023-12-09 NOTE — Progress Notes (Deleted)
 GYNECOLOGY OFFICE VISIT NOTE  History:   Danielle Gross is a 33 y.o. No obstetric history on file. here today for ***.   She denies any abnormal vaginal discharge, bleeding, pelvic pain or other concerns.     Past Medical History:  Diagnosis Date   Abnormal uterine and vaginal bleeding, unspecified 06/22/2018   Chicken pox    Chronic right-sided low back pain with right-sided sciatica 02/14/2020   Essential hypertension 06/22/2018   Generalized anxiety disorder 09/13/2018   Headache(784.0)    Migraine without aura and without status migrainosus, not intractable 06/22/2018   Migraines    Tobacco abuse 02/14/2020   Urge incontinence of urine 08/23/2018    No past surgical history on file.  The following portions of the patient's history were reviewed and updated as appropriate: allergies, current medications, past family history, past medical history, past social history, past surgical history and problem list.   Health Maintenance:   Normal pap and negative HRHPV on ***.   No results found for: DIAGPAP   Normal mammogram on ***.   Review of Systems:  Pertinent items noted in HPI and remainder of comprehensive ROS otherwise negative.  Physical Exam:  There were no vitals taken for this visit. CONSTITUTIONAL: Well-developed, well-nourished female in no acute distress.  HEENT:  Normocephalic, atraumatic. External right and left ear normal. No scleral icterus.  NECK: Normal range of motion, supple, no masses noted on observation SKIN: No rash noted. Not diaphoretic. No erythema. No pallor. MUSCULOSKELETAL: Normal range of motion. No edema noted. NEUROLOGIC: Alert and oriented to person, place, and time. Normal muscle tone coordination. No cranial nerve deficit noted. PSYCHIATRIC: Normal mood and affect. Normal behavior. Normal judgment and thought content.  CARDIOVASCULAR: Normal heart rate noted RESPIRATORY: Effort and breath sounds normal, no problems with  respiration noted ABDOMEN: No masses noted. No other overt distention noted.    PELVIC: {Blank single:19197::Deferred,Normal appearing external genitalia; normal urethral meatus; normal appearing vaginal mucosa and cervix.  No abnormal discharge noted.  Normal uterine size, no other palpable masses, no uterine or adnexal tenderness. Performed in the presence of a chaperone}     GYNECOLOGY OFFICE PROCEDURE NOTE   ENDOMETRIAL BIOPSY     The indications for endometrial biopsy were reviewed.   Risks of the biopsy including cramping, bleeding, infection, uterine perforation, inadequate specimen and need for additional procedures were discussed. Offered alternative of hysteroscopy, dilation and curettage in OR. The patient states she understands the R/B/I/A and agrees to undergo procedure today. Urine pregnancy test was {Blank single:19197::Negative,Not indicated}. Consent was signed. Time out was performed.    Patient was positioned in dorsal lithotomy position. A vaginal speculum was placed.  The cervix was visualized and was prepped with Betadine.  A single-toothed tenaculum was placed on the anterior lip of the cervix to stabilize it. The 3 mm pipelle was easily introduced into the endometrial cavity without difficulty to a depth of *** cm, and a {Blank single:19197::Scant,Moderate} amount of tissue was obtained after two passes and sent to pathology. The instruments were removed from the patient's vagina. Minimal bleeding from the cervix was noted. The patient tolerated the procedure well.  Assessment and Plan:   1. DUB (dysfunctional uterine bleeding) (Primary) ***  2. Right ovarian cyst ***  3. Cervical cancer screening ***    Diagnoses and all orders for this visit:  DUB (dysfunctional uterine bleeding)  Right ovarian cyst  Cervical cancer screening     No orders of the defined  types were placed in this encounter.    Routine preventative health maintenance  measures emphasized. Please refer to After Visit Summary for other counseling recommendations.   No follow-ups on file.  Vina Solian, MD, FACOG Obstetrician & Gynecologist, [facility], [facility]

## 2023-12-28 ENCOUNTER — Ambulatory Visit (HOSPITAL_COMMUNITY): Payer: Self-pay

## 2023-12-31 ENCOUNTER — Ambulatory Visit (HOSPITAL_BASED_OUTPATIENT_CLINIC_OR_DEPARTMENT_OTHER): Payer: Medicaid Other | Admitting: Orthopaedic Surgery

## 2024-05-10 ENCOUNTER — Ambulatory Visit: Admitting: General Practice

## 2024-10-03 ENCOUNTER — Telehealth: Payer: Self-pay | Admitting: Physician Assistant

## 2024-10-03 ENCOUNTER — Encounter: Payer: Self-pay | Admitting: Radiology

## 2024-10-03 DIAGNOSIS — Z760 Encounter for issue of repeat prescription: Secondary | ICD-10-CM | POA: Diagnosis not present

## 2024-10-03 DIAGNOSIS — I10 Essential (primary) hypertension: Secondary | ICD-10-CM

## 2024-10-03 MED ORDER — HYDROCHLOROTHIAZIDE 25 MG PO TABS: 25.0000 mg | ORAL_TABLET | Freq: Every day | ORAL | 0 refills | Status: AC

## 2024-10-03 NOTE — Progress Notes (Signed)
 Virtual Visit Consent   Danielle Gross, you are scheduled for a virtual visit with a [facility] provider today. Just as with appointments in the office, your consent must be obtained to participate. Your consent will be active for this visit and any virtual visit you may have with one of our providers in the next 365 days. If you have a MyChart account, a copy of this consent can be sent to you electronically.  As this is a virtual visit, video technology does not allow for your provider to perform a traditional examination. This may limit your provider's ability to fully assess your condition. If your provider identifies any concerns that need to be evaluated in person or the need to arrange testing (such as labs, EKG, etc.), we will make arrangements to do so. Although advances in technology are sophisticated, we cannot ensure that it will always work on either your end or our end. If the connection with a video visit is poor, the visit may have to be switched to a telephone visit. With either a video or telephone visit, we are not always able to ensure that we have a secure connection.  By engaging in this virtual visit, you consent to the provision of healthcare and authorize for your insurance to be billed (if applicable) for the services provided during this visit. Depending on your insurance coverage, you may receive a charge related to this service.  I need to obtain your verbal consent now. Are you willing to proceed with your visit today? [name] has provided verbal consent on 10/03/2024 for a virtual visit (video or telephone). Danielle Gross, [place]  Date: 10/03/2024 7:29 PM   Virtual Visit via Video Note   I, Danielle Gross, connected with  [name]  ([id number], 20-Oct-1991) on 10/03/24 at  7:15 PM EST by a video-enabled telemedicine application and verified that I am speaking with the correct person using two identifiers.  Location: Patient: Virtual Visit Location Patient:  Home Provider: Virtual Visit Location Provider: Home Office   I discussed the limitations of evaluation and management by telemedicine and the availability of in person appointments. The patient expressed understanding and agreed to proceed.    History of Present Illness: Danielle Gross is a 33 y.o. who identifies as a female who was assigned female at birth, and is being seen today for medication refill.  HPI: Medication Refill This is a recurrent problem. The current episode started today. The problem occurs constantly. The problem has been unchanged. Pertinent negatives include no abdominal pain, anorexia, arthralgias, change in bowel habit, chest pain, chills, congestion, coughing, diaphoresis, fatigue, fever, headaches, joint swelling, myalgias, nausea, neck pain, numbness, rash, sore throat, swollen glands, urinary symptoms, vertigo, visual change, vomiting or weakness. Nothing aggravates the symptoms. She has tried nothing for the symptoms. The treatment provided no relief.    Problems:  Patient Active Problem List   Diagnosis Date Noted   Abnormal Pap smear of cervix 02/28/2013   Abnormal uterine bleeding 02/13/2011   HEADACHE 07/10/2008    Allergies:  Allergies  Allergen Reactions   Penicillins Hives   Medications:  Current Outpatient Medications:    acetaminophen  (TYLENOL ) 325 MG tablet, Take 650 mg by mouth every 6 (six) hours as needed for mild pain., Disp: , Rfl:    cetirizine (ZYRTEC) 10 MG tablet, Take by mouth., Disp: , Rfl:    clindamycin  (CLEOCIN ) 150 MG capsule, Take 1 capsule (150 mg total) by mouth every 6 (six) hours.,  Disp: 28 capsule, Rfl: 0   hydrochlorothiazide  (HYDRODIURIL ) 25 MG tablet, Take 0.5 tablets (12.5 mg total) by mouth daily., Disp: 30 tablet, Rfl: 0   Multiple Vitamin (MULTIVITAMIN) tablet, Take 1 tablet by mouth daily., Disp: , Rfl:    naproxen sodium (ANAPROX) 220 MG tablet, Take 220 mg by mouth 2 (two) times daily as needed (pain)., Disp: , Rfl:     SUMAtriptan  (IMITREX ) 25 MG tablet, Take 1 tablet (25 mg total) by mouth once for 1 dose. Please take 1 tablet at the onset of your headache. May repeat in 2 hours if headache persists or recurs. No more than 4 tablets a day, Disp: 20 tablet, Rfl: 0   traMADol  (ULTRAM ) 50 MG tablet, Take 1 tablet (50 mg total) by mouth every 6 (six) hours as needed., Disp: 10 tablet, Rfl: 0  Observations/Objective: Patient is well-developed, well-nourished in no acute distress.  Resting comfortably  at home.  Head is normocephalic, atraumatic.  No labored breathing.  Speech is clear and coherent with logical content.  Patient is alert and oriented at baseline.    Assessment and Plan: 1. Encounter for issue of repeat prescription (Primary)  2. Essential hypertension, benign  Refilled patients hydrochlorothiazide . Advised that she will need to have a new PCP to continue ongoing care. Patient agreed to the plan.   Follow Up Instructions: I discussed the assessment and treatment plan with the patient. The patient was provided an opportunity to ask questions and all were answered. The patient agreed with the plan and demonstrated an understanding of the instructions.  A copy of instructions were sent to the patient via MyChart unless otherwise noted below.    The patient was advised to call back or seek an in-person evaluation if the symptoms worsen or if the condition fails to improve as anticipated.    Danielle Shuck, PA-C

## 2024-10-03 NOTE — Patient Instructions (Signed)
  Mardy JINNY Gavel, thank you for joining [name], PA-C for today's virtual visit.  While this provider is not your primary care provider (PCP), if your PCP is located in our provider database this encounter information will be shared with them immediately following your visit.   A [facility] MyChart account gives you access to today's visit and all your visits, tests, and labs performed at [facility]  click here if you don't have a [facility] MyChart account or go to [email or web address]  Consent: (Patient) Mardy JINNY Gavel provided verbal consent for this virtual visit at the beginning of the encounter.  Current Medications:  Current Outpatient Medications:    acetaminophen  (TYLENOL ) 325 MG tablet, Take 650 mg by mouth every 6 (six) hours as needed for mild pain., Disp: , Rfl:    cetirizine (ZYRTEC) 10 MG tablet, Take by mouth., Disp: , Rfl:    clindamycin  (CLEOCIN ) 150 MG capsule, Take 1 capsule (150 mg total) by mouth every 6 (six) hours., Disp: 28 capsule, Rfl: 0   hydrochlorothiazide  (HYDRODIURIL ) 25 MG tablet, Take 0.5 tablets (12.5 mg total) by mouth daily., Disp: 30 tablet, Rfl: 0   Multiple Vitamin (MULTIVITAMIN) tablet, Take 1 tablet by mouth daily., Disp: , Rfl:    naproxen sodium (ANAPROX) 220 MG tablet, Take 220 mg by mouth 2 (two) times daily as needed (pain)., Disp: , Rfl:    SUMAtriptan  (IMITREX ) 25 MG tablet, Take 1 tablet (25 mg total) by mouth once for 1 dose. Please take 1 tablet at the onset of your headache. May repeat in 2 hours if headache persists or recurs. No more than 4 tablets a day, Disp: 20 tablet, Rfl: 0   traMADol  (ULTRAM ) 50 MG tablet, Take 1 tablet (50 mg total) by mouth every 6 (six) hours as needed., Disp: 10 tablet, Rfl: 0   Medications ordered in this encounter:  No orders of the defined types were placed in this encounter.    *If you need refills on other medications prior to your next appointment, please contact your  pharmacy*  Follow-Up: Call back or seek an in-person evaluation if the symptoms worsen or if the condition fails to improve as anticipated.  [facility] [phone]  Other Instructions Follow up with primary provider in 24-48 hours. Report to nearest ER with any worsening symptoms.    If you have been instructed to have an in-person evaluation today at a local Urgent Care facility, please use the link below. It will take you to a list of all of our available [facility], including address, phone number and hours of operation. Please do not delay care.  [facility]  If you or a family member do not have a primary care provider, use the link below to schedule a visit and establish care. When you choose a [facility] primary care physician or advanced practice provider, you gain a long-term partner in health. Find a Primary Care Provider  Learn more about [facility]'s in-office and virtual care options: [facility] Get Care Now
